# Patient Record
Sex: Male | Born: 1960 | Race: White | Hispanic: No | Marital: Married | Smoking: Former smoker
Health system: Southern US, Community
[De-identification: ages and names within clinical notes are randomized; demographics above are authoritative.]

## PROBLEM LIST (undated history)

## (undated) ENCOUNTER — Emergency Department (HOSPITAL_COMMUNITY): Payer: BLUE CROSS/BLUE SHIELD | Source: Home / Self Care

## (undated) DIAGNOSIS — C609 Malignant neoplasm of penis, unspecified: Secondary | ICD-10-CM

## (undated) DIAGNOSIS — I714 Abdominal aortic aneurysm, without rupture, unspecified: Secondary | ICD-10-CM

## (undated) DIAGNOSIS — I723 Aneurysm of iliac artery: Secondary | ICD-10-CM

## (undated) DIAGNOSIS — S0990XA Unspecified injury of head, initial encounter: Secondary | ICD-10-CM

## (undated) DIAGNOSIS — S82891A Other fracture of right lower leg, initial encounter for closed fracture: Secondary | ICD-10-CM

## (undated) DIAGNOSIS — I5022 Chronic systolic (congestive) heart failure: Secondary | ICD-10-CM

## (undated) DIAGNOSIS — S82892A Other fracture of left lower leg, initial encounter for closed fracture: Secondary | ICD-10-CM

## (undated) DIAGNOSIS — I2119 ST elevation (STEMI) myocardial infarction involving other coronary artery of inferior wall: Secondary | ICD-10-CM

## (undated) DIAGNOSIS — I10 Essential (primary) hypertension: Secondary | ICD-10-CM

## (undated) DIAGNOSIS — I480 Paroxysmal atrial fibrillation: Secondary | ICD-10-CM

## (undated) DIAGNOSIS — I2510 Atherosclerotic heart disease of native coronary artery without angina pectoris: Secondary | ICD-10-CM

## (undated) HISTORY — DX: Abdominal aortic aneurysm, without rupture, unspecified: I71.40

## (undated) HISTORY — DX: Aneurysm of iliac artery: I72.3

## (undated) HISTORY — DX: Atherosclerotic heart disease of native coronary artery without angina pectoris: I25.10

## (undated) HISTORY — DX: Malignant neoplasm of penis, unspecified: C60.9

## (undated) HISTORY — PX: HERNIA REPAIR: SHX51

## (undated) HISTORY — DX: Abdominal aortic aneurysm, without rupture: I71.4

## (undated) HISTORY — DX: ST elevation (STEMI) myocardial infarction involving other coronary artery of inferior wall: I21.19

---
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
## 2012-10-29 ENCOUNTER — Encounter (HOSPITAL_COMMUNITY): Payer: Self-pay | Admitting: Emergency Medicine

## 2012-10-29 ENCOUNTER — Emergency Department (HOSPITAL_COMMUNITY)
Admission: EM | Admit: 2012-10-29 | Discharge: 2012-10-29 | Disposition: A | Discharge status: Home / Self Care | Payer: 59 | Attending: Emergency Medicine | Admitting: Emergency Medicine

## 2012-10-29 DIAGNOSIS — T7840XA Allergy, unspecified, initial encounter: Secondary | ICD-10-CM

## 2012-10-29 DIAGNOSIS — F17200 Nicotine dependence, unspecified, uncomplicated: Secondary | ICD-10-CM | POA: Insufficient documentation

## 2012-10-29 DIAGNOSIS — I10 Essential (primary) hypertension: Secondary | ICD-10-CM | POA: Insufficient documentation

## 2012-10-29 DIAGNOSIS — R059 Cough, unspecified: Secondary | ICD-10-CM | POA: Insufficient documentation

## 2012-10-29 DIAGNOSIS — L299 Pruritus, unspecified: Secondary | ICD-10-CM | POA: Insufficient documentation

## 2012-10-29 DIAGNOSIS — R05 Cough: Secondary | ICD-10-CM | POA: Insufficient documentation

## 2012-10-29 MED ORDER — PREDNISONE 10 MG PO TABS: 20.0000 mg | ORAL_TABLET | Freq: Every day | ORAL | Status: DC

## 2012-10-29 MED ORDER — METHYLPREDNISOLONE SODIUM SUCC 125 MG IJ SOLR
125.0000 mg | Freq: Once | INTRAMUSCULAR | Status: AC
  Administered 2012-10-29: 125 mg via INTRAVENOUS
  Filled 2012-10-29: qty 2

## 2012-10-29 MED ORDER — SODIUM CHLORIDE 0.9 % IN NEBU
INHALATION_SOLUTION | RESPIRATORY_TRACT | Status: AC
  Administered 2012-10-29: 02:00:00
  Filled 2012-10-29: qty 3

## 2012-10-29 MED ORDER — ALBUTEROL SULFATE (5 MG/ML) 0.5% IN NEBU
2.5000 mg | INHALATION_SOLUTION | Freq: Once | RESPIRATORY_TRACT | Status: AC
  Administered 2012-10-29: 2.5 mg via RESPIRATORY_TRACT
  Filled 2012-10-29: qty 0.5

## 2012-10-29 MED ORDER — ONDANSETRON HCL 4 MG/2ML IJ SOLN
INTRAMUSCULAR | Status: AC
  Filled 2012-10-29: qty 2

## 2012-10-29 MED ORDER — DIPHENHYDRAMINE HCL 50 MG/ML IJ SOLN
25.0000 mg | Freq: Once | INTRAMUSCULAR | Status: AC
  Administered 2012-10-29: 25 mg via INTRAVENOUS
  Filled 2012-10-29: qty 1

## 2012-10-29 MED ORDER — FAMOTIDINE 20 MG PO TABS
20.0000 mg | ORAL_TABLET | Freq: Once | ORAL | Status: AC
  Administered 2012-10-29: 20 mg via ORAL
  Filled 2012-10-29: qty 1

## 2012-10-29 MED ORDER — ONDANSETRON HCL 4 MG/2ML IJ SOLN
4.0000 mg | Freq: Once | INTRAMUSCULAR | Status: AC
  Administered 2012-10-29: 4 mg via INTRAVENOUS

## 2012-10-29 NOTE — ED Provider Notes (Signed)
History     CSN: [id number]  Arrival date & time 10/29/12  0101   First MD Initiated Contact with Patient 10/29/12 0133      Chief Complaint  Patient presents with  . Allergic Reaction  . Shortness of Breath    (Consider location/radiation/quality/duration/timing/severity/associated sxs/prior treatment) HPI  Jeremy Raymond is a 51 y.o. male who presents to the Emergency Department complaining of allergic reaction that woke him from sleep with itching, hives, feeling hot, coughing. He is not aware of any new exposures, foods, clothing.Denies fever, chills, shortness of breath. He states he has had a cough for several days and with the onset of hives and itching his throat felt scratchy.  History reviewed. No pertinent past medical history.  History reviewed. No pertinent past surgical history.  No family history on file.  History  Substance Use Topics  . Smoking status: Current Every Day Smoker  . Smokeless tobacco: Not on file  . Alcohol Use: Yes      Review of Systems  Constitutional: Negative for fever.       10 Systems reviewed and are negative for acute change except as noted in the HPI.  HENT: Negative for congestion.   Eyes: Negative for discharge and redness.  Respiratory: Negative for cough and shortness of breath.   Cardiovascular: Negative for chest pain.  Gastrointestinal: Negative for vomiting and abdominal pain.  Musculoskeletal: Negative for back pain.  Skin: Negative for rash.       Hives, rash, itching  Neurological: Negative for syncope, numbness and headaches.  Psychiatric/Behavioral:       No behavior change.    Allergies  Review of patient's allergies indicates no known allergies.  Home Medications   Current Outpatient Rx  Name Route Sig Dispense Refill  . PREDNISONE 10 MG PO TABS Oral Take 2 tablets (20 mg total) by mouth daily. 10 tablet 0    BP 146/98  Pulse 93  Temp 97.8 F (36.6 C) (Oral)  Resp 18  Ht 5\' 9"  (1.753 m)  Wt  255 lb (115.667 kg)  BMI 37.66 kg/m2  SpO2 96%  Physical Exam  Nursing note and vitals reviewed. Constitutional: He is oriented to person, place, and time. He appears well-developed and well-nourished.       Awake, alert, nontoxic appearance.  HENT:  Head: Atraumatic.  Right Ear: External ear normal.  Left Ear: External ear normal.  Nose: Nose normal.  Mouth/Throat: Oropharynx is clear and moist.  Eyes: Right eye exhibits no discharge. Left eye exhibits no discharge.  Neck: Neck supple.  Cardiovascular: Normal heart sounds.   Pulmonary/Chest: Effort normal. He exhibits no tenderness.       Coughing, rales at both bases that clear partially with cough  Abdominal: Soft. Bowel sounds are normal. There is no tenderness. There is no rebound.  Musculoskeletal: Normal range of motion. He exhibits no tenderness.       Baseline ROM, no obvious new focal weakness.  Neurological: He is alert and oriented to person, place, and time.       Mental status and motor strength appears baseline for patient and situation.  Skin: Rash noted.       Hives to groin, buttock, axilla, face  Psychiatric: He has a normal mood and affect.    ED Course  Procedures (including critical care time)   Date: 10/29/2012    0131  Rate: 109  Rhythm: sinus tachycardia  QRS Axis: right  Intervals: normal  ST/T Wave abnormalities: normal  Conduction Disutrbances:none  Narrative Interpretation:   Old EKG Reviewed: none available   1. Allergic reaction       MDM  Patient with non specific allergic reaction characterized by hives, itching, erythematous rash. Given solumedrol, pepcid, benadryl with resolution of rash, itching, and hives. Received albuterol/atrovent for rales with improvement.  Observed in the ER with no recurrence.  Pt feels improved after observation and/or treatment in ED.Pt stable in ED with no significant deterioration in condition.The patient appears reasonably screened and/or stabilized for  discharge and I doubt any other medical condition or other [facility] requiring further screening, evaluation, or treatment in the ED at this time prior to discharge.  MDM Reviewed: nursing note and vitals           Nicoletta Dress. Colon Branch, MD 10/29/12 [id number]

## 2012-10-29 NOTE — ED Notes (Signed)
Patient states he ate jalapeno peppers, grilled onions and ranch tonight.  States he began having shortness of breath around midnight; states ate at 8pm.  Patient states he feels like his throat is swelling; patient handling secretions well.  Patient able to speak in complete sentences without difficulty.

## 2017-04-10 ENCOUNTER — Other Ambulatory Visit: Payer: Self-pay | Admitting: Urology

## 2017-05-16 ENCOUNTER — Encounter (HOSPITAL_BASED_OUTPATIENT_CLINIC_OR_DEPARTMENT_OTHER): Payer: Self-pay | Admitting: *Deleted

## 2017-05-16 NOTE — Progress Notes (Signed)
To [facility] at 0600-Istat,Ekg on arrival-Npo after Mn-no smoking also.

## 2017-05-22 NOTE — H&P (Signed)
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  He has had difficulties with rolling his foreskin back for 2 years. His symptoms have gotten worse over the last year. He does not have diabetes.   He does not have dysuria. He has had inflammation of the head of his penis. He has not had to use creams on the lesion.   He was evaluated in 3/18 for penile pain. He indicated he works 7 days a week and developed abrasions on the glans and distal shaft of his penis because of his clothing and sweating in the summer of 2017. Since then he indicated he had difficulty with recurrent abrasions/irritation to the penis. It occurred about every other week. He also indicated he was concerned that his penis was shrinking as his shaft skin had begun to develop the glans (weight 270 pounds). A clean catch urine appeared to be infected and he was placed on empiric antibiotics but the urine culture proved negative and was likely a contaminated specimen.    He said that over 2 years ago after eating some hot sauce he developed a rash and difficulty breathing and into the having to go to the hospital. He said since that time he has not been able to pull the skin back over the head of his penis although he has been circumcised as an infant. This condition would be considered a moderate severity with no modifying factors or other associated signs impotence.      ALLERGIES: No Known Drug Allergies Pepper - Hives    MEDICATIONS: Lisinopril 10 mg tablet  Aspirin Ec 81 mg tablet, delayed release     GU PSH: None   NON-GU PSH: Hernia Repair    GU PMH: None   NON-GU PMH: Hypertension    FAMILY HISTORY: cardiac disorder - Father Death of family member - Father   SOCIAL HISTORY: Marital Status: Married Current Smoking Status: Patient smokes. Has smoked since 03/30/1977. Smokes 1 pack per day.   Tobacco Use Assessment Completed: Used Tobacco in  last 30 days? Does not use smokeless tobacco. Drinks 3 drinks per month.  Drinks 1 caffeinated drink per day. Patient's occupation Geneticist, molecular.    REVIEW OF SYSTEMS:    GU Review Male:   Patient reports frequent urination, burning/ pain with urination, get up at night to urinate, leakage of urine, stream starts and stops, trouble starting your stream, and have to strain to urinate . Patient denies hard to postpone urination, erection problems, and penile pain.  Gastrointestinal (Upper):   Patient denies nausea, vomiting, and indigestion/ heartburn.  Gastrointestinal (Lower):   Patient denies diarrhea and constipation.  Constitutional:   Patient denies fever, night sweats, weight loss, and fatigue.  Skin:   Patient denies skin rash/ lesion and itching.  Eyes:   Patient denies blurred vision and double vision.  Ears/ Nose/ Throat:   Patient reports sinus problems. Patient denies sore throat.  Hematologic/Lymphatic:   Patient denies swollen glands and easy bruising.  Cardiovascular:   Patient denies leg swelling and chest pains.  Respiratory:   Patient reports cough. Patient denies shortness of breath.  Endocrine:   Patient denies excessive thirst.  Musculoskeletal:   Patient denies back pain and joint pain.  Neurological:   Patient denies dizziness and headaches.  Psychologic:   Patient denies depression and anxiety.   VITAL SIGNS:   Weight 262 lb / 118.84 kg  Height 69  in / 175.26 cm  BP 128/88 mmHg  Pulse 76 /min  Temperature 97.6 F / 36 C  BMI 38.7 kg/m   GU PHYSICAL EXAMINATION:    Scrotum: No lesions. No edema. No cysts. No warts.  Epididymides: Right: no spermatocele, no masses, no cysts, no tenderness, no induration, no enlargement. Left: no spermatocele, no masses, no cysts, no tenderness, no induration, no enlargement.  Testes: No tenderness, no swelling, no enlargement left testes. No tenderness, no swelling, no enlargement right testes. Normal location left  testes. Normal location right testes. No mass, no cyst, no varicocele, no hydrocele left testes. No mass, no cyst, no varicocele, no hydrocele right testes.  Urethral Meatus: Normal size. No lesion, no wart, no discharge, no polyp. Normal location.  Penis: Circumcised but the glans is enveloped with the shaft skin which has been pushed out over the end of the penis and this skin has circumferential depigmentation and narrowing to the point where it cannot be pulled back over the head of the penis. Palpating the glans I note some nodularity distally that seemed to be tender to palpation as well. Due to his significant phimosis I was unable to visualize this area.   MULTI-SYSTEM PHYSICAL EXAMINATION:    Constitutional: Well-nourished. No physical deformities. Normally developed. Good grooming.  Neck: Neck symmetrical, not swollen. Normal tracheal position.  Respiratory: No labored breathing, no use of accessory muscles.   Cardiovascular: Normal temperature, normal extremity pulses, no swelling, no varicosities.  Lymphatic: No enlargement of neck, axillae, groin.  Skin: No paleness, no jaundice, no cyanosis. No lesion, no ulcer, no rash.  Neurologic / Psychiatric: Oriented to time, oriented to place, oriented to person. No depression, no anxiety, no agitation.  Gastrointestinal: Obese abdomen. No mass, no tenderness, no rigidity.   Eyes: Normal conjunctivae. Normal eyelids.  Ears, Nose, Mouth, and Throat: Left ear no scars, no lesions, no masses. Right ear no scars, no lesions, no masses. Nose no scars, no lesions, no masses. Normal hearing. Normal lips.  Musculoskeletal: Normal gait and station of head and neck.     PAST DATA REVIEWED:  Source Of History:  Patient  Lab Test Review:   PSA, BUN/Creatinine  Records Review:   Previous Doctor Records, POC Tool  Urine Test Review:   Urine Culture   04/01/17  PSA  Total PSA 0.60 ng/dl   Notes:                     His creatinine was noted to be  normal in 4/18 at 0.99.   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 I have reassured him the penis does not shrink.  2   Phimosis - N47.1 His obesity has caused the skin from the shaft of his penis to envelop the glans and it is then become scarred down with possible BXO. This may just be scarring from previous irritation but either way the skin cannot be retracted over the head of the penis. The problem is he has been circumcised already and therefore excising more penile shaft skin will result in very little shaft skin being left and I told him that if he would lose weight he would bend and up having too little skin on the penis which would result in the scrotum riding up onto the shaft. He told me that he did not think he would be losing weight any time soon. He understands this risk. It is unfortunately necessary in order to allow his relatively buried penis to be visualized and evaluated further.   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 I have discussed with the patient the fact that once visualized biopsy of this area may be necessary.     We discussed the procedure planned including the incision used, the risks and complications, the probability of success, the outpatient nature of the procedure as well as the anticipated postoperative course. He understands and has elected to proceed.

## 2017-05-23 ENCOUNTER — Encounter (HOSPITAL_BASED_OUTPATIENT_CLINIC_OR_DEPARTMENT_OTHER): Payer: Self-pay | Admitting: *Deleted

## 2017-05-23 ENCOUNTER — Encounter (HOSPITAL_BASED_OUTPATIENT_CLINIC_OR_DEPARTMENT_OTHER)
Admission: RE | Disposition: A | Discharge status: Home / Self Care | Payer: Self-pay | Source: Ambulatory Visit | Attending: Urology

## 2017-05-23 ENCOUNTER — Ambulatory Visit (HOSPITAL_BASED_OUTPATIENT_CLINIC_OR_DEPARTMENT_OTHER)
Admission: RE | Admit: 2017-05-23 | Discharge: 2017-05-23 | Disposition: A | Discharge status: Home / Self Care | Payer: BLUE CROSS/BLUE SHIELD | Source: Ambulatory Visit | Attending: Urology | Admitting: Urology

## 2017-05-23 ENCOUNTER — Other Ambulatory Visit: Payer: Self-pay

## 2017-05-23 ENCOUNTER — Ambulatory Visit (HOSPITAL_BASED_OUTPATIENT_CLINIC_OR_DEPARTMENT_OTHER): Payer: BLUE CROSS/BLUE SHIELD | Admitting: Anesthesiology

## 2017-05-23 DIAGNOSIS — N4889 Other specified disorders of penis: Secondary | ICD-10-CM | POA: Insufficient documentation

## 2017-05-23 DIAGNOSIS — Z6837 Body mass index (BMI) 37.0-37.9, adult: Secondary | ICD-10-CM | POA: Diagnosis not present

## 2017-05-23 DIAGNOSIS — N480 Leukoplakia of penis: Secondary | ICD-10-CM | POA: Diagnosis not present

## 2017-05-23 DIAGNOSIS — I10 Essential (primary) hypertension: Secondary | ICD-10-CM | POA: Insufficient documentation

## 2017-05-23 DIAGNOSIS — N476 Balanoposthitis: Secondary | ICD-10-CM | POA: Diagnosis not present

## 2017-05-23 DIAGNOSIS — F17210 Nicotine dependence, cigarettes, uncomplicated: Secondary | ICD-10-CM | POA: Diagnosis not present

## 2017-05-23 DIAGNOSIS — E669 Obesity, unspecified: Secondary | ICD-10-CM | POA: Insufficient documentation

## 2017-05-23 DIAGNOSIS — N359 Urethral stricture, unspecified: Secondary | ICD-10-CM | POA: Insufficient documentation

## 2017-05-23 DIAGNOSIS — N4883 Acquired buried penis: Secondary | ICD-10-CM | POA: Diagnosis not present

## 2017-05-23 DIAGNOSIS — Z7982 Long term (current) use of aspirin: Secondary | ICD-10-CM | POA: Insufficient documentation

## 2017-05-23 DIAGNOSIS — C601 Malignant neoplasm of glans penis: Secondary | ICD-10-CM | POA: Insufficient documentation

## 2017-05-23 DIAGNOSIS — N471 Phimosis: Secondary | ICD-10-CM

## 2017-05-23 HISTORY — DX: Other fracture of right lower leg, initial encounter for closed fracture: S82.891A

## 2017-05-23 HISTORY — DX: Other fracture of left lower leg, initial encounter for closed fracture: S82.892A

## 2017-05-23 HISTORY — DX: Unspecified injury of head, initial encounter: S09.90XA

## 2017-05-23 HISTORY — PX: PENILE BIOPSY: SHX6013

## 2017-05-23 HISTORY — PX: CIRCUMCISION: SHX1350

## 2017-05-23 HISTORY — DX: Essential (primary) hypertension: I10

## 2017-05-23 LAB — POCT I-STAT, CHEM 8
BUN: 17 mg/dL (ref 6–20)
Calcium, Ion: 1.27 mmol/L (ref 1.15–1.40)
Chloride: 100 mmol/L — ABNORMAL LOW (ref 101–111)
Creatinine, Ser: 1 mg/dL (ref 0.61–1.24)
Glucose, Bld: 94 mg/dL (ref 65–99)
HCT: 48 % (ref 39.0–52.0)
Hemoglobin: 16.3 g/dL (ref 13.0–17.0)
Potassium: 4.5 mmol/L (ref 3.5–5.1)
Sodium: 138 mmol/L (ref 135–145)
TCO2: 27 mmol/L (ref 0–100)

## 2017-05-23 SURGERY — CIRCUMCISION, ADULT
Anesthesia: General | Site: Penis

## 2017-05-23 MED ORDER — FENTANYL CITRATE (PF) 100 MCG/2ML IJ SOLN
INTRAMUSCULAR | Status: DC | PRN
  Administered 2017-05-23 (×2): 50 ug via INTRAVENOUS

## 2017-05-23 MED ORDER — OXYCODONE HCL 5 MG/5ML PO SOLN
5.0000 mg | Freq: Once | ORAL | Status: DC | PRN
  Filled 2017-05-23: qty 5

## 2017-05-23 MED ORDER — CEFAZOLIN SODIUM-DEXTROSE 2-4 GM/100ML-% IV SOLN
2.0000 g | INTRAVENOUS | Status: AC
  Administered 2017-05-23: 2 g via INTRAVENOUS
  Filled 2017-05-23: qty 100

## 2017-05-23 MED ORDER — BACITRACIN-NEOMYCIN-POLYMYXIN 400-5-5000 EX OINT
TOPICAL_OINTMENT | CUTANEOUS | Status: DC | PRN
Start: 2017-05-23 — End: 2017-05-23
  Administered 2017-05-23: 1 via TOPICAL

## 2017-05-23 MED ORDER — HYDROCODONE-ACETAMINOPHEN 10-325 MG PO TABS: 1.0000 | ORAL_TABLET | ORAL | 0 refills | Status: DC | PRN

## 2017-05-23 MED ORDER — LIDOCAINE 2% (20 MG/ML) 5 ML SYRINGE
INTRAMUSCULAR | Status: DC | PRN
  Administered 2017-05-23: 100 mg via INTRAVENOUS

## 2017-05-23 MED ORDER — PROPOFOL 10 MG/ML IV BOLUS
INTRAVENOUS | Status: AC
  Filled 2017-05-23: qty 40

## 2017-05-23 MED ORDER — DEXAMETHASONE SODIUM PHOSPHATE 10 MG/ML IJ SOLN
INTRAMUSCULAR | Status: AC
  Filled 2017-05-23: qty 1

## 2017-05-23 MED ORDER — CEFAZOLIN SODIUM-DEXTROSE 2-4 GM/100ML-% IV SOLN
INTRAVENOUS | Status: AC
  Filled 2017-05-23: qty 100

## 2017-05-23 MED ORDER — MIDAZOLAM HCL 5 MG/5ML IJ SOLN
INTRAMUSCULAR | Status: DC | PRN
  Administered 2017-05-23: 2 mg via INTRAVENOUS

## 2017-05-23 MED ORDER — DEXAMETHASONE SODIUM PHOSPHATE 10 MG/ML IJ SOLN
INTRAMUSCULAR | Status: DC | PRN
  Administered 2017-05-23: 10 mg via INTRAVENOUS

## 2017-05-23 MED ORDER — ONDANSETRON HCL 4 MG/2ML IJ SOLN
4.0000 mg | Freq: Four times a day (QID) | INTRAMUSCULAR | Status: DC | PRN
  Filled 2017-05-23: qty 2

## 2017-05-23 MED ORDER — BUPIVACAINE HCL (PF) 0.25 % IJ SOLN
INTRAMUSCULAR | Status: DC | PRN
  Administered 2017-05-23: 10 mL

## 2017-05-23 MED ORDER — MIDAZOLAM HCL 2 MG/2ML IJ SOLN
INTRAMUSCULAR | Status: AC
  Filled 2017-05-23: qty 2

## 2017-05-23 MED ORDER — FENTANYL CITRATE (PF) 100 MCG/2ML IJ SOLN
25.0000 ug | INTRAMUSCULAR | Status: DC | PRN
  Administered 2017-05-23 (×2): 50 ug via INTRAVENOUS
  Filled 2017-05-23: qty 1

## 2017-05-23 MED ORDER — PROPOFOL 10 MG/ML IV BOLUS
INTRAVENOUS | Status: DC | PRN
  Administered 2017-05-23: 200 mg via INTRAVENOUS

## 2017-05-23 MED ORDER — ONDANSETRON HCL 4 MG/2ML IJ SOLN
INTRAMUSCULAR | Status: DC | PRN
  Administered 2017-05-23: 4 mg via INTRAVENOUS

## 2017-05-23 MED ORDER — FENTANYL CITRATE (PF) 100 MCG/2ML IJ SOLN
INTRAMUSCULAR | Status: AC
  Filled 2017-05-23: qty 2

## 2017-05-23 MED ORDER — LABETALOL HCL 5 MG/ML IV SOLN
INTRAVENOUS | Status: DC | PRN
  Administered 2017-05-23 (×2): 5 mg via INTRAVENOUS

## 2017-05-23 MED ORDER — LACTATED RINGERS IV SOLN
INTRAVENOUS | Status: DC
  Administered 2017-05-23 (×2): via INTRAVENOUS
  Filled 2017-05-23: qty 1000

## 2017-05-23 MED ORDER — LABETALOL HCL 5 MG/ML IV SOLN
INTRAVENOUS | Status: AC
  Filled 2017-05-23: qty 4

## 2017-05-23 MED ORDER — KETOROLAC TROMETHAMINE 30 MG/ML IJ SOLN
INTRAMUSCULAR | Status: DC | PRN
  Administered 2017-05-23: 30 mg via INTRAVENOUS

## 2017-05-23 MED ORDER — ONDANSETRON HCL 4 MG/2ML IJ SOLN
INTRAMUSCULAR | Status: AC
  Filled 2017-05-23: qty 2

## 2017-05-23 MED ORDER — LIDOCAINE 2% (20 MG/ML) 5 ML SYRINGE
INTRAMUSCULAR | Status: AC
  Filled 2017-05-23: qty 5

## 2017-05-23 MED ORDER — SULFAMETHOXAZOLE-TRIMETHOPRIM 400-80 MG PO TABS: 1.0000 | ORAL_TABLET | Freq: Two times a day (BID) | ORAL | 0 refills | Status: DC

## 2017-05-23 MED ORDER — OXYCODONE HCL 5 MG PO TABS
5.0000 mg | ORAL_TABLET | Freq: Once | ORAL | Status: DC | PRN
  Filled 2017-05-23: qty 1

## 2017-05-23 MED ORDER — KETOROLAC TROMETHAMINE 30 MG/ML IJ SOLN
INTRAMUSCULAR | Status: AC
  Filled 2017-05-23: qty 1

## 2017-05-23 SURGICAL SUPPLY — 35 items
BANDAGE CO FLEX L/F 1IN X 5YD (GAUZE/BANDAGES/DRESSINGS) IMPLANT
BANDAGE CO FLEX L/F 2IN X 5YD (GAUZE/BANDAGES/DRESSINGS) ×2 IMPLANT
BLADE CLIPPER SURG (BLADE) ×2 IMPLANT
BLADE SURG 15 STRL LF DISP TIS (BLADE) ×1 IMPLANT
BLADE SURG 15 STRL SS (BLADE) ×1
BNDG COHESIVE 1X5 TAN STRL LF (GAUZE/BANDAGES/DRESSINGS) ×2 IMPLANT
CLOTH BEACON ORANGE TIMEOUT ST (SAFETY) ×2 IMPLANT
COVER BACK TABLE 60X90IN (DRAPES) ×2 IMPLANT
COVER MAYO STAND STRL (DRAPES) ×2 IMPLANT
DRAIN PENROSE 18X1/4 LTX STRL (WOUND CARE) ×2 IMPLANT
DRAPE LAPAROTOMY 100X72 PEDS (DRAPES) ×2 IMPLANT
ELECT REM PT RETURN 9FT ADLT (ELECTROSURGICAL) ×2
ELECTRODE REM PT RTRN 9FT ADLT (ELECTROSURGICAL) ×1 IMPLANT
GLOVE BIO SURGEON STRL SZ8 (GLOVE) ×2 IMPLANT
GOWN STRL REUS W/ TWL LRG LVL3 (GOWN DISPOSABLE) ×1 IMPLANT
GOWN STRL REUS W/ TWL XL LVL3 (GOWN DISPOSABLE) ×1 IMPLANT
GOWN STRL REUS W/TWL LRG LVL3 (GOWN DISPOSABLE) ×1
GOWN STRL REUS W/TWL XL LVL3 (GOWN DISPOSABLE) ×1
IV NS IRRIG 3000ML ARTHROMATIC (IV SOLUTION) IMPLANT
KIT RM TURNOVER CYSTO AR (KITS) ×2 IMPLANT
MANIFOLD NEPTUNE II (INSTRUMENTS) IMPLANT
NEEDLE HYPO 22GX1.5 SAFETY (NEEDLE) ×2 IMPLANT
NS IRRIG 500ML POUR BTL (IV SOLUTION) IMPLANT
PACK BASIN DAY SURGERY FS (CUSTOM PROCEDURE TRAY) ×2 IMPLANT
PENCIL BUTTON HOLSTER BLD 10FT (ELECTRODE) ×2 IMPLANT
SUPPORT SCROTAL XLRG NO STRP (MISCELLANEOUS) ×2 IMPLANT
SUT CHROMIC 3 0 SH 27 (SUTURE) ×4 IMPLANT
SUT CHROMIC 4 0 RB 1X27 (SUTURE) IMPLANT
SUT CHROMIC 5 0 RB 1 27 (SUTURE) ×2 IMPLANT
SYR CONTROL 10ML LL (SYRINGE) ×2 IMPLANT
TOWEL OR 17X24 6PK STRL BLUE (TOWEL DISPOSABLE) ×4 IMPLANT
TRAY DSU PREP LF (CUSTOM PROCEDURE TRAY) ×2 IMPLANT
TUBE CONNECTING 12X1/4 (SUCTIONS) ×2 IMPLANT
WATER STERILE IRR 3000ML UROMA (IV SOLUTION) ×2 IMPLANT
WATER STERILE IRR 500ML POUR (IV SOLUTION) ×2 IMPLANT

## 2017-05-23 NOTE — Transfer of Care (Signed)
Immediate Anesthesia Transfer of Care Note  Patient: Jeremy Raymond  Procedure(s) Performed: Procedure(s): CIRCUMCISION ADULT (N/A) PENILE BIOPSY (N/A)  Patient Location: PACU  Anesthesia Type:General  Level of Consciousness: sedated and responds to stimulation  Airway & Oxygen Therapy: Patient Spontanous Breathing and Patient connected to nasal cannula oxygen  Post-op Assessment: Report given to RN  Post vital signs: Reviewed and stable  Last Vitals:133/84, 72, 16, 93% 98.4 Vitals:   05/23/17 0615  BP: 118/75  Pulse: 70  Resp: 20  Temp: 37.2 C    Last Pain:  Vitals:   05/23/17 0621  TempSrc:   PainSc: 5       Patients Stated Pain Goal: 8 (16/10/96 0454)  Complications: No apparent anesthesia complications

## 2017-05-23 NOTE — Anesthesia Procedure Notes (Signed)
Procedure Name: LMA Insertion Date/Time: 05/23/2017 7:30 AM Performed by: Bethena Roys T Pre-anesthesia Checklist: Patient identified, Emergency Drugs available, Suction available and Patient being monitored Patient Re-evaluated:Patient Re-evaluated prior to inductionOxygen Delivery Method: Circle system utilized Preoxygenation: Pre-oxygenation with 100% oxygen Intubation Type: IV induction Ventilation: Mask ventilation without difficulty LMA: LMA inserted LMA Size: 5.0 Number of attempts: 1 Airway Equipment and Method: Bite block Placement Confirmation: positive ETCO2 Tube secured with: Tape Dental Injury: Teeth and Oropharynx as per pre-operative assessment

## 2017-05-23 NOTE — Anesthesia Preprocedure Evaluation (Signed)
Anesthesia Evaluation  Patient identified by MRN, date of birth, ID band Patient awake    Reviewed: Allergy & Precautions, H&P , NPO status , Patient's Chart, lab work & pertinent test results  Airway Mallampati: II   Neck ROM: full    Dental   Pulmonary Current Smoker,    breath sounds clear to auscultation       Cardiovascular hypertension,  Rhythm:regular Rate:Normal     Neuro/Psych    GI/Hepatic   Endo/Other  obese  Renal/GU      Musculoskeletal   Abdominal   Peds  Hematology   Anesthesia Other Findings   Reproductive/Obstetrics                             Anesthesia Physical Anesthesia Plan  ASA: II  Anesthesia Plan: General   Post-op Pain Management:    Induction: Intravenous  Airway Management Planned: LMA  Additional Equipment:   Intra-op Plan:   Post-operative Plan:   Informed Consent: I have reviewed the patients History and Physical, chart, labs and discussed the procedure including the risks, benefits and alternatives for the proposed anesthesia with the patient or authorized representative who has indicated his/her understanding and acceptance.     Plan Discussed with: CRNA, Anesthesiologist and Surgeon  Anesthesia Plan Comments:         Anesthesia Quick Evaluation

## 2017-05-23 NOTE — Anesthesia Postprocedure Evaluation (Signed)
Anesthesia Post Note  Patient: Jeremy Raymond  Procedure(s) Performed: Procedure(s) (LRB): CIRCUMCISION ADULT (N/A) PENILE BIOPSY (N/A)  Patient location during evaluation: PACU Anesthesia Type: General Level of consciousness: awake and alert and patient cooperative Pain management: pain level controlled Vital Signs Assessment: post-procedure vital signs reviewed and stable Respiratory status: spontaneous breathing and respiratory function stable Cardiovascular status: stable Anesthetic complications: no       Last Vitals:  Vitals:   05/23/17 1020 05/23/17 1022  BP: (!) 178/89 (!) 155/96  Pulse: 70   Resp: 18   Temp: 36.6 C     Last Pain:  Vitals:   05/23/17 1020  TempSrc: Oral  PainSc:                  Elsinore S

## 2017-05-23 NOTE — Discharge Instructions (Signed)
Post Anesthesia Home Care Instructions  Activity: Get plenty of rest for the remainder of the day. A responsible individual must stay with you for 24 hours following the procedure.  For the next 24 hours, DO NOT: -Drive a car -Paediatric nurse -Drink alcoholic beverages -Take any medication unless instructed by your physician -Make any legal decisions or sign important papers.  Meals: Start with liquid foods such as gelatin or soup. Progress to regular foods as tolerated. Avoid greasy, spicy, heavy foods. If nausea and/or vomiting occur, drink only clear liquids until the nausea and/or vomiting subsides. Call your physician if vomiting continues.  Special Instructions/Symptoms: Your throat may feel dry or sore from the anesthesia or the breathing tube placed in your throat during surgery. If this causes discomfort, gargle with warm salt water. The discomfort should disappear within 24 hours.  If you had a scopolamine patch placed behind your ear for the management of post- operative nausea and/or vomiting:  1. The medication in the patch is effective for 72 hours, after which it should be removed.  Wrap patch in a tissue and discard in the trash. Wash hands thoroughly with soap and water. 2. You may remove the patch earlier than 72 hours if you experience unpleasant side effects which may include dry mouth, dizziness or visual disturbances. 3. Avoid touching the patch. Wash your hands with soap and water after contact with the patch.   Postoperative instructions for circumcision  Wound:  In most cases your incision will have absorbable sutures that run along the course of your incision and will dissolve within the first 10-20 days. Some will fall out even earlier. Expect some redness as the sutures dissolved but this should occur only around the sutures. If there is generalized redness, especially with increasing pain or swelling, let us know. The penis will very likely get "black and  blue" as the blood in the tissues spread. Sometimes the whole penis will turn colors. The black and blue is followed by a yellow and brown color. In time, all the discoloration will go away.  Diet:  You may return to your normal diet within 24 hours following your surgery. You may note some mild nausea and possibly vomiting the first 6-8 hours following surgery. This is usually due to the side effects of anesthesia, and will disappear quite soon. I would suggest clear liquids and a very light meal the first evening following your surgery.  Activity:  Your physical activity should be restricted the first 48 hours. During that time you should remain relatively inactive, moving about only when necessary. During the first 7-10 days following surgery he should avoid lifting any heavy objects (anything greater than 15 pounds), and avoid strenuous exercise. If you work, ask [place] specifically about your restrictions, both for work and home. We will write a note to your employer if needed.  Ice packs can be placed on and off over the penis for the first 48 hours to help relieve the pain and keep the swelling down. Frozen peas or corn in a ZipLock bag can be frozen, used and re-frozen. Fifteen minutes on and 15 minutes off is a reasonable schedule.   Hygiene:  You may shower 48 hours after your surgery. Tub bathing should be restricted until the seventh day.  Medication:  You will be sent home with some type of pain medication. In many cases you will be sent home with a narcotic pain pill (Vicodin or Tylox). If the pain is not too bad,  you may take either Tylenol (acetaminophen) or Advil (ibuprofen) which contain no narcotic agents, and might be tolerated a little better, with fewer side effects. If the pain medication you are sent home with does not control the pain, you will have to let us know. Some narcotic pain medications cannot be given or refilled by a phone call to a pharmacy.  Problems you should  report to [place]:   Fever of 101.0 degrees Fahrenheit or greater.  Moderate or severe swelling under the skin incision or involving the scrotum.  Drug reaction such as hives, a rash, nausea or vomiting.

## 2017-05-23 NOTE — Op Note (Signed)
PATIENT:  Jeremy Raymond  PRE-OPERATIVE DIAGNOSIS: 1. Penile pain 2. Acquired buried penis. 3. Phimosis 4. Glans mass.  POST-OPERATIVE DIAGNOSIS: 1. Penile pain 2. Acquired buried penis. 3. Phimosis 4. Glans mass. 5. Balanoposthitis  6. BXO 7. Meatal stenosis  PROCEDURE: 1. Circumcision 2. Biopsy of glans penis  3. Meatal dilation.  SURGEON:  Claybon Jabs  INDICATION: Jeremy Raymond is a 56 year old male who has been having penile pain for 2 years. He also has developed the inability to retract his foreskin. He was noted on examination to have depigmentation of the distal shaft skin which had been developed the penis and resulted in an inability to retract the skin over the head of the penis. In addition he had a firm area felt in the area of the glans on examination but it could not be visualized.  ANESTHESIA:  General  EBL: 5 mL   DRAINS: None  LOCAL MEDICATIONS USED: 10 mL of 1/2% plain Marcaine   SPECIMEN:  1. Aerobic culture  2. Biopsy of glans 3. Distal shaft skin  Description of procedure: After informed consent the patient was taken to the operating room and placed on the table in a supine position. General anesthesia was then administered. Once fully anesthetized the genitalia were sterilely prepped and draped in standard fashion. An official timeout was then performed.   I initially noted that when I tried to retract the shaft skin over the head of the penis there was tan purulent material consistent with infection beneath the enveloping shaft skin so I obtained a swab from this location for culture and sensitivity. I then placed a straight hemostat across the skin at the 12:00 position and clamped this. After releasing this I cut and was able to then retract the foreskin after developing this small dorsal slit. This allowed evaluation of the glans.  I first noted that there was a circumferential firm, slightly rough-appearing area involving the surface around the  urethral meatus which had resulted in significant meatal stenosis. I therefore dilated the urethral meatus using a hemostat. I then proceeded to obtain a wedge biopsy of the glans from the area of abnormality. This was then closed with interrupted 5-0 chromic.  I then made a circumcising incision about 6 mm proximal to the corona circumferentially. I made a second circumcising incision taking care not to excise too much shaft skin but remove enough of the clearly abnormal appearing distal shaft skin. Bleeding points were cauterized and I then reapproximated the skin edges with running, 3-0 chromic suture. Neosporin was applied to the incision, I performed a dorsal penile block with the Marcaine and fluffed Curlex and a scrotal support were applied and the patient was awakened and taken to the recovery room in stable and satisfactory condition. He tolerated the procedure well with no intraoperative complications.   PLAN OF CARE: Discharge to home after PACU  PATIENT DISPOSITION:  PACU - hemodynamically stable.

## 2017-05-25 LAB — AEROBIC CULTURE W GRAM STAIN (SUPERFICIAL SPECIMEN): Culture: NORMAL

## 2017-05-27 ENCOUNTER — Encounter (HOSPITAL_BASED_OUTPATIENT_CLINIC_OR_DEPARTMENT_OTHER): Payer: Self-pay | Admitting: Urology

## 2018-04-15 ENCOUNTER — Ambulatory Visit: Payer: BLUE CROSS/BLUE SHIELD | Admitting: Vascular Surgery

## 2018-04-15 ENCOUNTER — Encounter: Payer: Self-pay | Admitting: Vascular Surgery

## 2018-04-15 VITALS — BP 130/80 | HR 80 | Temp 98.3°F | Resp 12

## 2018-04-15 DIAGNOSIS — I723 Aneurysm of iliac artery: Secondary | ICD-10-CM | POA: Diagnosis not present

## 2018-04-15 DIAGNOSIS — I714 Abdominal aortic aneurysm, without rupture, unspecified: Secondary | ICD-10-CM
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
## 2018-04-16 ENCOUNTER — Other Ambulatory Visit: Payer: Self-pay

## 2018-04-16 DIAGNOSIS — I713 Abdominal aortic aneurysm, ruptured, unspecified: Secondary | ICD-10-CM

## 2018-04-28 ENCOUNTER — Other Ambulatory Visit: Payer: Self-pay

## 2018-04-28 DIAGNOSIS — I713 Abdominal aortic aneurysm, ruptured, unspecified: Secondary | ICD-10-CM

## 2018-05-04 ENCOUNTER — Other Ambulatory Visit: Payer: Self-pay

## 2018-05-04 ENCOUNTER — Emergency Department (HOSPITAL_COMMUNITY): Payer: BLUE CROSS/BLUE SHIELD

## 2018-05-04 ENCOUNTER — Emergency Department (HOSPITAL_COMMUNITY)
Admission: EM | Admit: 2018-05-04 | Discharge: 2018-05-04 | Disposition: A | Discharge status: Home / Self Care | Payer: BLUE CROSS/BLUE SHIELD | Attending: Emergency Medicine | Admitting: Emergency Medicine

## 2018-05-04 ENCOUNTER — Encounter (HOSPITAL_COMMUNITY): Payer: Self-pay

## 2018-05-04 DIAGNOSIS — F17200 Nicotine dependence, unspecified, uncomplicated: Secondary | ICD-10-CM | POA: Diagnosis not present

## 2018-05-04 DIAGNOSIS — Z859 Personal history of malignant neoplasm, unspecified: Secondary | ICD-10-CM | POA: Diagnosis not present

## 2018-05-04 DIAGNOSIS — R339 Retention of urine, unspecified: Secondary | ICD-10-CM | POA: Diagnosis present

## 2018-05-04 DIAGNOSIS — N5089 Other specified disorders of the male genital organs: Secondary | ICD-10-CM | POA: Insufficient documentation

## 2018-05-04 DIAGNOSIS — I10 Essential (primary) hypertension: Secondary | ICD-10-CM | POA: Diagnosis not present

## 2018-05-04 DIAGNOSIS — Z79899 Other long term (current) drug therapy: Secondary | ICD-10-CM | POA: Diagnosis not present

## 2018-05-04 LAB — URINALYSIS, ROUTINE W REFLEX MICROSCOPIC
BILIRUBIN URINE: NEGATIVE
Glucose, UA: NEGATIVE mg/dL
Ketones, ur: NEGATIVE mg/dL
NITRITE: NEGATIVE
Protein, ur: NEGATIVE mg/dL
SPECIFIC GRAVITY, URINE: 1.02 (ref 1.005–1.030)
pH: 5 (ref 5.0–8.0)

## 2018-05-04 LAB — CBC WITH DIFFERENTIAL/PLATELET
BASOS PCT: 0 %
Basophils Absolute: 0 10*3/uL (ref 0.0–0.1)
EOS ABS: 0.4 10*3/uL (ref 0.0–0.7)
Eosinophils Relative: 5 %
HCT: 43.1 % (ref 39.0–52.0)
Hemoglobin: 14.5 g/dL (ref 13.0–17.0)
Lymphocytes Relative: 8 %
Lymphs Abs: 0.6 10*3/uL — ABNORMAL LOW (ref 0.7–4.0)
MCH: 32.3 pg (ref 26.0–34.0)
MCHC: 33.6 g/dL (ref 30.0–36.0)
MCV: 96 fL (ref 78.0–100.0)
Monocytes Absolute: 0.5 10*3/uL (ref 0.1–1.0)
Monocytes Relative: 6 %
NEUTROS PCT: 81 %
Neutro Abs: 6 10*3/uL (ref 1.7–7.7)
PLATELETS: 297 10*3/uL (ref 150–400)
RBC: 4.49 MIL/uL (ref 4.22–5.81)
RDW: 13.1 % (ref 11.5–15.5)
WBC: 7.5 10*3/uL (ref 4.0–10.5)

## 2018-05-04 LAB — COMPREHENSIVE METABOLIC PANEL
ALT: 14 U/L — ABNORMAL LOW (ref 17–63)
ANION GAP: 11 (ref 5–15)
AST: 14 U/L — ABNORMAL LOW (ref 15–41)
Albumin: 3.5 g/dL (ref 3.5–5.0)
Alkaline Phosphatase: 125 U/L (ref 38–126)
BUN: 11 mg/dL (ref 6–20)
CALCIUM: 8.9 mg/dL (ref 8.9–10.3)
CHLORIDE: 103 mmol/L (ref 101–111)
CO2: 25 mmol/L (ref 22–32)
Creatinine, Ser: 0.94 mg/dL (ref 0.61–1.24)
GFR calc non Af Amer: >60 mL/min (ref >60)
Glucose, Bld: 101 mg/dL — ABNORMAL HIGH (ref 65–99)
Potassium: 4 mmol/L (ref 3.5–5.1)
SODIUM: 139 mmol/L (ref 135–145)
Total Bilirubin: 0.3 mg/dL (ref 0.3–1.2)
Total Protein: 7.4 g/dL (ref 6.5–8.1)

## 2018-05-04 MED ORDER — SODIUM CHLORIDE 0.9 % IV BOLUS
1000.0000 mL | Freq: Once | INTRAVENOUS | Status: AC
  Administered 2018-05-04: 1000 mL via INTRAVENOUS

## 2018-05-04 MED ORDER — ONDANSETRON HCL 4 MG/2ML IJ SOLN
4.0000 mg | Freq: Once | INTRAMUSCULAR | Status: AC
  Administered 2018-05-04: 4 mg via INTRAVENOUS
  Filled 2018-05-04: qty 2

## 2018-05-04 MED ORDER — HYDROMORPHONE HCL 1 MG/ML IJ SOLN
1.0000 mg | Freq: Once | INTRAMUSCULAR | Status: AC
  Administered 2018-05-04: 1 mg via INTRAVENOUS
  Filled 2018-05-04: qty 1

## 2018-05-04 MED ORDER — CEPHALEXIN 500 MG PO CAPS: 500.0000 mg | ORAL_CAPSULE | Freq: Four times a day (QID) | ORAL | 0 refills | Status: AC

## 2018-05-04 MED ORDER — MORPHINE SULFATE (PF) 4 MG/ML IV SOLN
4.0000 mg | Freq: Once | INTRAVENOUS | Status: AC
  Administered 2018-05-04: 4 mg via INTRAVENOUS
  Filled 2018-05-04: qty 1
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
## 2018-05-04 NOTE — ED Triage Notes (Addendum)
Patient states he has bilateral scrotal swelling. Patient has a history of penile cancer. patiaent also reports that he has had problems urinating x 1 week and states he has not voided since 0900.

## 2018-05-04 NOTE — Discharge Instructions (Addendum)
Please take antibiotic for the next week.   Follow up with your Urologist Dr. Edd Arbour regarding your ER visit.   Please return to the Emergency Department for any new or concerning symptoms like fever >100.74F, vomiting, if you are unable to urinate or have any new or worsening pain.

## 2018-05-04 NOTE — ED Notes (Signed)
Have explained to pt that we are waiting on Urology. RN has called urology to see about what time pt could expect them. Pt is being informed but is still getting impatient and walking out in hall.

## 2018-05-04 NOTE — ED Notes (Signed)
Reassured patient and family that a provider will see him shortly-apologized for the delay

## 2018-05-04 NOTE — ED Notes (Signed)
Ultrasound at bedside

## 2018-05-20 ENCOUNTER — Ambulatory Visit (HOSPITAL_COMMUNITY): Admission: RE | Admit: 2018-05-20 | Payer: BLUE CROSS/BLUE SHIELD | Source: Ambulatory Visit

## 2018-05-20 ENCOUNTER — Ambulatory Visit: Payer: BLUE CROSS/BLUE SHIELD | Admitting: Vascular Surgery

## 2018-05-21 NOTE — Progress Notes (Deleted)
Established Abdominal Aortic Aneurysm   History of Present Illness   Jeremy Raymond is a 57 y.o. (01/03/1961) male who presents with chief complaint: follow up on CTA abd/pelvis.  Pt has undergone partial penectomy and XRT.  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  The patient returns today with CTA abd/pelvis.  The patient's also recently was seen in the ED with difficulty urinating related to possible urethral stricture.  The patient's PMH, PSH, SH, and FamHx were reviewed on 05/27/18 are unchanged from 04/15/18.  Current Outpatient Medications  Medication Sig Dispense Refill  . HYDROcodone-acetaminophen (NORCO) 10-325 MG tablet Take 1-2 tablets by mouth every 4 (four) hours as needed for moderate pain. Maximum dose per 24 hours - 8 pills (Patient not taking: Reported on 05/04/2018) 30 tablet 0  . lisinopril (PRINIVIL,ZESTRIL) 10 MG tablet Take 10 mg by mouth daily.    . predniSONE (DELTASONE) 10 MG tablet Take 2 tablets (20 mg total) by mouth daily. (Patient not taking: Reported on 05/04/2018) 10 tablet 0  . sulfamethoxazole-trimethoprim (BACTRIM) 400-80 MG tablet Take 1 tablet by mouth 2 (two) times daily. (Patient not taking: Reported on 05/04/2018) 10 tablet 0  . traMADol (ULTRAM) 50 MG tablet Take 50 mg by mouth every 6 (six) hours as needed for moderate pain.     No current facility-administered medications for this visit.     On ROS today: ***, ***   Physical Examination  ***There were no vitals filed for this visit. ***There is no height or weight on file to calculate BMI.  General {LOC:19197::"Somulent","Alert"}, {Orientation:19197::"Confused","O x 3"}, {Weight:19197::"Obese","Cachectic","WD"}, {General state of health:19197::"Ill appearing","Elderly","NAD"}  Pulmonary {Chest wall:19197::"Asx chest movement","Sym exp"}, {Air movt:19197::"Decreased *** air movt","good B air movt"}, {BS:19197::"rales on ***","rhonchi on ***","wheezing on  ***","CTA B"}  Cardiac {Rhythm:19197::"Irregularly, irregular rate and rhythm","RRR, Nl S1, S2"}, {Murmur:19197::"Murmur present: ***","no Murmurs"}, {Rubs:19197::"Rub present: ***","No rubs"}, {Gallop:19197::"Gallop present: ***","No S3,S4"}  Vascular Vessel Right Left  Radial {Palpable:19197::"Not palpable","Faintly palpable","Palpable"} {Palpable:19197::"Not palpable","Faintly palpable","Palpable"}  Brachial {Palpable:19197::"Not palpable","Faintly palpable","Palpable"} {Palpable:19197::"Not palpable","Faintly palpable","Palpable"}  Carotid Palpable, {Bruit:19197::"Bruit present","No Bruit"} Palpable, {Bruit:19197::"Bruit present","No Bruit"}  Aorta Not palpable N/A  Femoral {Palpable:19197::"Not palpable","Faintly palpable","Palpable"} {Palpable:19197::"Not palpable","Faintly palpable","Palpable"}  Popliteal {Palpable:19197::"Prominently palpable","Not palpable"} {Palpable:19197::"Prominently palpable","Not palpable"}  PT {Palpable:19197::"Not palpable","Faintly palpable","Palpable"} {Palpable:19197::"Not palpable","Faintly palpable","Palpable"}  DP {Palpable:19197::"Not palpable","Faintly palpable","Palpable"} {Palpable:19197::"Not palpable","Faintly palpable","Palpable"}    Gastro- intestinal soft, {Distension:19197::"distended","non-distended"}, {TTP:19197::"TTP in *** quadrant","appropriate tenderness to palpation","non-tender to palpation"}, {Guarding:19197::"Guarding and rebound in *** quadrant","No guarding or rebound"}, {HSM:19197::"HSM present","no HSM"}, {Masses:19197::"Mass present: ***","no masses"}, {Flank:19197::"CVAT on ***","Flank bruit present on ***","no CVAT B"}, {AAA:19197::"Palpable prominent aortic pulse","No palpable prominent aortic pulse"}, {Surgical incision:19197::"Surg incisions: ***","Surgical incisions well healed"," "}  Musculo- skeletal M/S 5/5 throughout {MS:19197::"except ***"," "}, Extremities without ischemic changes {MS:19197::"except ***"," "},  {Edema:19197::"Pitting edema present: ***","Non-pitting edema present: ***","No edema present"}, {Varicosities:19197::"Varicosities present: ***","No visible varicosities "}, {LDS:19197::"Lipodermatosclerosis present: ***","No Lipodermatosclerosis present"}  Neurologic Pain and light touch intact in extremities{CN:19197::" except for decreased sensation in ***"," "}, Motor exam as listed above    Radiology     CTA Abd/pelvis (***) ***   Medical Decision Making   Jeremy Raymond is a 57 y.o. (03/04/1961) male who presents with: asx small AAA, large R CIA aneurysm   Based on this patient's exam and diagnostic studies, he needs ***.  The threshold for repair is AAA size > 5.5 cm, growth > 1 cm/yr, and symptomatic status.  I emphasized the importance of maximal medical management including strict control of blood pressure, blood glucose,  and lipid levels, antiplatelet agents, obtaining regular exercise, and cessation of smoking.    Thank you for allowing [place] to participate in this patient's care.   Adele Barthel, MD, FACS [facility] Office: [phone] Pager: [phone]

## 2018-05-27 ENCOUNTER — Ambulatory Visit: Payer: BLUE CROSS/BLUE SHIELD | Admitting: Vascular Surgery

## 2018-06-18 ENCOUNTER — Emergency Department (HOSPITAL_COMMUNITY): Payer: BLUE CROSS/BLUE SHIELD

## 2018-06-18 ENCOUNTER — Encounter (HOSPITAL_COMMUNITY): Payer: Self-pay | Admitting: Emergency Medicine

## 2018-06-18 ENCOUNTER — Other Ambulatory Visit: Payer: Self-pay

## 2018-06-18 ENCOUNTER — Inpatient Hospital Stay (HOSPITAL_COMMUNITY)
Admission: EM | Admit: 2018-06-18 | Discharge: 2018-06-23 | DRG: 247 | Disposition: A | Discharge status: Home / Self Care | Payer: BLUE CROSS/BLUE SHIELD | Source: Other Acute Inpatient Hospital | Attending: Cardiology | Admitting: Cardiology

## 2018-06-18 ENCOUNTER — Encounter (HOSPITAL_COMMUNITY)
Admission: EM | Disposition: A | Discharge status: Home / Self Care | Payer: Self-pay | Source: Other Acute Inpatient Hospital | Attending: Cardiovascular Disease

## 2018-06-18 DIAGNOSIS — I472 Ventricular tachycardia: Secondary | ICD-10-CM | POA: Diagnosis not present

## 2018-06-18 DIAGNOSIS — I10 Essential (primary) hypertension: Secondary | ICD-10-CM | POA: Diagnosis present

## 2018-06-18 DIAGNOSIS — Z6835 Body mass index (BMI) 35.0-35.9, adult: Secondary | ICD-10-CM

## 2018-06-18 DIAGNOSIS — E785 Hyperlipidemia, unspecified: Secondary | ICD-10-CM | POA: Diagnosis present

## 2018-06-18 DIAGNOSIS — I714 Abdominal aortic aneurysm, without rupture: Secondary | ICD-10-CM | POA: Diagnosis present

## 2018-06-18 DIAGNOSIS — I2111 ST elevation (STEMI) myocardial infarction involving right coronary artery: Secondary | ICD-10-CM | POA: Diagnosis present

## 2018-06-18 DIAGNOSIS — I213 ST elevation (STEMI) myocardial infarction of unspecified site: Secondary | ICD-10-CM

## 2018-06-18 DIAGNOSIS — C609 Malignant neoplasm of penis, unspecified: Secondary | ICD-10-CM | POA: Diagnosis present

## 2018-06-18 DIAGNOSIS — I2119 ST elevation (STEMI) myocardial infarction involving other coronary artery of inferior wall: Secondary | ICD-10-CM | POA: Diagnosis present

## 2018-06-18 DIAGNOSIS — I2510 Atherosclerotic heart disease of native coronary artery without angina pectoris: Secondary | ICD-10-CM

## 2018-06-18 DIAGNOSIS — Z79891 Long term (current) use of opiate analgesic: Secondary | ICD-10-CM | POA: Diagnosis not present

## 2018-06-18 DIAGNOSIS — I959 Hypotension, unspecified: Secondary | ICD-10-CM | POA: Diagnosis not present

## 2018-06-18 DIAGNOSIS — I471 Supraventricular tachycardia: Secondary | ICD-10-CM | POA: Diagnosis not present

## 2018-06-18 DIAGNOSIS — I4510 Unspecified right bundle-branch block: Secondary | ICD-10-CM | POA: Diagnosis present

## 2018-06-18 DIAGNOSIS — E669 Obesity, unspecified: Secondary | ICD-10-CM | POA: Diagnosis present

## 2018-06-18 DIAGNOSIS — Z8249 Family history of ischemic heart disease and other diseases of the circulatory system: Secondary | ICD-10-CM | POA: Diagnosis not present

## 2018-06-18 DIAGNOSIS — F17210 Nicotine dependence, cigarettes, uncomplicated: Secondary | ICD-10-CM | POA: Diagnosis present

## 2018-06-18 DIAGNOSIS — Z91018 Allergy to other foods: Secondary | ICD-10-CM

## 2018-06-18 DIAGNOSIS — Z833 Family history of diabetes mellitus: Secondary | ICD-10-CM | POA: Diagnosis not present

## 2018-06-18 DIAGNOSIS — Z79899 Other long term (current) drug therapy: Secondary | ICD-10-CM

## 2018-06-18 DIAGNOSIS — Z955 Presence of coronary angioplasty implant and graft: Secondary | ICD-10-CM

## 2018-06-18 DIAGNOSIS — I723 Aneurysm of iliac artery: Secondary | ICD-10-CM | POA: Diagnosis present

## 2018-06-18 HISTORY — PX: LEFT HEART CATH AND CORONARY ANGIOGRAPHY: CATH118249

## 2018-06-18 HISTORY — PX: CORONARY/GRAFT ACUTE MI REVASCULARIZATION: CATH118305

## 2018-06-18 LAB — LIPID PANEL
CHOL/HDL RATIO: 3.3 ratio
Cholesterol: 129 mg/dL (ref 0–200)
HDL: 39 mg/dL — ABNORMAL LOW (ref >40)
LDL Cholesterol: 76 mg/dL (ref 0–99)
Triglycerides: 71 mg/dL (ref <150)
VLDL: 14 mg/dL (ref 0–40)

## 2018-06-18 LAB — CBC WITH DIFFERENTIAL/PLATELET
Abs Immature Granulocytes: 0 10*3/uL (ref 0.0–0.1)
BASOS PCT: 1 %
Basophils Absolute: 0 10*3/uL (ref 0.0–0.1)
EOS ABS: 0.1 10*3/uL (ref 0.0–0.7)
EOS PCT: 2 %
HCT: 40.4 % (ref 39.0–52.0)
Hemoglobin: 12.9 g/dL — ABNORMAL LOW (ref 13.0–17.0)
Immature Granulocytes: 1 %
Lymphocytes Relative: 7 %
Lymphs Abs: 0.6 10*3/uL — ABNORMAL LOW (ref 0.7–4.0)
MCH: 30.2 pg (ref 26.0–34.0)
MCHC: 31.9 g/dL (ref 30.0–36.0)
MCV: 94.6 fL (ref 78.0–100.0)
MONO ABS: 0.4 10*3/uL (ref 0.1–1.0)
MONOS PCT: 5 %
NEUTROS PCT: 84 %
Neutro Abs: 6.6 10*3/uL (ref 1.7–7.7)
Platelets: 294 10*3/uL (ref 150–400)
RBC: 4.27 MIL/uL (ref 4.22–5.81)
RDW: 13.1 % (ref 11.5–15.5)
WBC: 7.8 10*3/uL (ref 4.0–10.5)

## 2018-06-18 LAB — PROTIME-INR
INR: 1.1
Prothrombin Time: 14.1 seconds (ref 11.4–15.2)

## 2018-06-18 LAB — BRAIN NATRIURETIC PEPTIDE: B Natriuretic Peptide: 74.9 pg/mL (ref 0.0–100.0)

## 2018-06-18 LAB — HEMOGLOBIN A1C
Hgb A1c MFr Bld: 5.5 % (ref 4.8–5.6)
MEAN PLASMA GLUCOSE: 111.15 mg/dL

## 2018-06-18 LAB — APTT: APTT: 134 s — AB (ref 24–36)

## 2018-06-18 SURGERY — CORONARY/GRAFT ACUTE MI REVASCULARIZATION
Anesthesia: LOCAL

## 2018-06-18 MED ORDER — SODIUM CHLORIDE 0.9 % WEIGHT BASED INFUSION
1.0000 mL/kg/h | INTRAVENOUS | Status: AC
  Administered 2018-06-18: 1.001 mL/kg/h via INTRAVENOUS
  Administered 2018-06-19: 1 mL/kg/h via INTRAVENOUS

## 2018-06-18 MED ORDER — SODIUM CHLORIDE 0.9 % IV SOLN: 250.0000 mL | INTRAVENOUS | Status: DC | PRN

## 2018-06-18 MED ORDER — LIDOCAINE HCL (PF) 1 % IJ SOLN
INTRAMUSCULAR | Status: AC
  Filled 2018-06-18: qty 30

## 2018-06-18 MED ORDER — SODIUM CHLORIDE 0.9% FLUSH: 3.0000 mL | INTRAVENOUS | Status: DC | PRN

## 2018-06-18 MED ORDER — HEPARIN (PORCINE) IN NACL 2-0.9 UNITS/ML
INTRAMUSCULAR | Status: AC | PRN
  Administered 2018-06-18: 1000 mL

## 2018-06-18 MED ORDER — TRAMADOL HCL 50 MG PO TABS
50.0000 mg | ORAL_TABLET | Freq: Four times a day (QID) | ORAL | Status: DC | PRN
  Administered 2018-06-20 – 2018-06-23 (×7): 50 mg via ORAL
  Filled 2018-06-18 (×7): qty 1

## 2018-06-18 MED ORDER — CARVEDILOL 3.125 MG PO TABS
6.2500 mg | ORAL_TABLET | Freq: Two times a day (BID) | ORAL | Status: DC
  Administered 2018-06-19 – 2018-06-23 (×8): 6.25 mg via ORAL
  Filled 2018-06-18 (×3): qty 2
  Filled 2018-06-18 (×6): qty 1

## 2018-06-18 MED ORDER — ASPIRIN 81 MG PO CHEW: 324.0000 mg | CHEWABLE_TABLET | Freq: Once | ORAL | Status: DC

## 2018-06-18 MED ORDER — TICAGRELOR 90 MG PO TABS
90.0000 mg | ORAL_TABLET | Freq: Two times a day (BID) | ORAL | Status: DC
  Administered 2018-06-19 – 2018-06-23 (×9): 90 mg via ORAL
  Filled 2018-06-18 (×10): qty 1

## 2018-06-18 MED ORDER — LIDOCAINE HCL (PF) 1 % IJ SOLN
INTRAMUSCULAR | Status: DC | PRN
  Administered 2018-06-18: 2 mL via SUBCUTANEOUS

## 2018-06-18 MED ORDER — TICAGRELOR 90 MG PO TABS
ORAL_TABLET | ORAL | Status: DC | PRN
  Administered 2018-06-18: 180 mg via ORAL

## 2018-06-18 MED ORDER — NITROGLYCERIN 1 MG/10 ML FOR IR/CATH LAB
INTRA_ARTERIAL | Status: AC
  Filled 2018-06-18: qty 10

## 2018-06-18 MED ORDER — MORPHINE SULFATE (PF) 2 MG/ML IV SOLN
1.0000 mg | INTRAVENOUS | Status: DC | PRN
  Administered 2018-06-18 – 2018-06-22 (×2): 1 mg via INTRAVENOUS
  Filled 2018-06-18 (×2): qty 1

## 2018-06-18 MED ORDER — NITROGLYCERIN 1 MG/10 ML FOR IR/CATH LAB
INTRA_ARTERIAL | Status: DC | PRN
  Administered 2018-06-18: 200 ug via INTRACORONARY

## 2018-06-18 MED ORDER — ASPIRIN EC 81 MG PO TBEC
81.0000 mg | DELAYED_RELEASE_TABLET | Freq: Every day | ORAL | Status: DC
  Administered 2018-06-19 – 2018-06-23 (×4): 81 mg via ORAL
  Filled 2018-06-18 (×4): qty 1

## 2018-06-18 MED ORDER — NITROGLYCERIN 0.4 MG SL SUBL
SUBLINGUAL_TABLET | SUBLINGUAL | Status: AC
Start: 2018-06-18 — End: 2018-06-19
  Filled 2018-06-18: qty 1

## 2018-06-18 MED ORDER — FENTANYL CITRATE (PF) 100 MCG/2ML IJ SOLN
INTRAMUSCULAR | Status: AC
  Filled 2018-06-18: qty 2

## 2018-06-18 MED ORDER — HYDRALAZINE HCL 20 MG/ML IJ SOLN: 5.0000 mg | INTRAMUSCULAR | Status: AC | PRN

## 2018-06-18 MED ORDER — LABETALOL HCL 5 MG/ML IV SOLN: 10.0000 mg | INTRAVENOUS | Status: AC | PRN

## 2018-06-18 MED ORDER — NITROGLYCERIN 0.4 MG SL SUBL
0.4000 mg | SUBLINGUAL_TABLET | SUBLINGUAL | Status: DC | PRN
  Administered 2018-06-18: 0.4 mg via SUBLINGUAL
  Filled 2018-06-18: qty 1

## 2018-06-18 MED ORDER — ONDANSETRON HCL 4 MG/2ML IJ SOLN
4.0000 mg | Freq: Four times a day (QID) | INTRAMUSCULAR | Status: DC | PRN
  Filled 2018-06-18: qty 2

## 2018-06-18 MED ORDER — ACETAMINOPHEN 325 MG PO TABS
650.0000 mg | ORAL_TABLET | ORAL | Status: DC | PRN
  Administered 2018-06-19: 650 mg via ORAL
  Filled 2018-06-18: qty 2

## 2018-06-18 MED ORDER — SODIUM CHLORIDE 0.9 % IV SOLN
INTRAVENOUS | Status: DC
  Administered 2018-06-18: 19:00:00 via INTRAVENOUS

## 2018-06-18 MED ORDER — HEPARIN SODIUM (PORCINE) 5000 UNIT/ML IJ SOLN
5000.0000 [IU] | Freq: Three times a day (TID) | INTRAMUSCULAR | Status: DC
  Administered 2018-06-19 – 2018-06-22 (×10): 5000 [IU] via SUBCUTANEOUS
  Filled 2018-06-18 (×10): qty 1

## 2018-06-18 MED ORDER — VERAPAMIL HCL 2.5 MG/ML IV SOLN
INTRAVENOUS | Status: AC
  Filled 2018-06-18: qty 2

## 2018-06-18 MED ORDER — MIDAZOLAM HCL 2 MG/2ML IJ SOLN
INTRAMUSCULAR | Status: AC
  Filled 2018-06-18: qty 2

## 2018-06-18 MED ORDER — HEPARIN SODIUM (PORCINE) 1000 UNIT/ML IJ SOLN
INTRAMUSCULAR | Status: AC
  Filled 2018-06-18: qty 1

## 2018-06-18 MED ORDER — HEPARIN SODIUM (PORCINE) 1000 UNIT/ML IJ SOLN
INTRAMUSCULAR | Status: DC | PRN
  Administered 2018-06-18: 3000 [IU] via INTRAVENOUS
  Administered 2018-06-18: 8000 [IU] via INTRAVENOUS

## 2018-06-18 MED ORDER — VERAPAMIL HCL 2.5 MG/ML IV SOLN
INTRAVENOUS | Status: DC | PRN
  Administered 2018-06-18: 10 mL via INTRA_ARTERIAL

## 2018-06-18 MED ORDER — LABETALOL HCL 5 MG/ML IV SOLN
INTRAVENOUS | Status: DC | PRN
  Administered 2018-06-18: 10 mg via INTRAVENOUS

## 2018-06-18 MED ORDER — SODIUM CHLORIDE 0.9% FLUSH
3.0000 mL | Freq: Two times a day (BID) | INTRAVENOUS | Status: DC
  Administered 2018-06-19 – 2018-06-22 (×5): 3 mL via INTRAVENOUS

## 2018-06-18 MED ORDER — FENTANYL CITRATE (PF) 100 MCG/2ML IJ SOLN
INTRAMUSCULAR | Status: DC | PRN
  Administered 2018-06-18: 25 ug via INTRAVENOUS

## 2018-06-18 MED ORDER — NITROGLYCERIN 0.4 MG SL SUBL: 0.4000 mg | SUBLINGUAL_TABLET | SUBLINGUAL | Status: DC | PRN

## 2018-06-18 MED ORDER — TICAGRELOR 90 MG PO TABS
ORAL_TABLET | ORAL | Status: AC
  Filled 2018-06-18: qty 2

## 2018-06-18 MED ORDER — ATORVASTATIN CALCIUM 80 MG PO TABS
80.0000 mg | ORAL_TABLET | Freq: Every day | ORAL | Status: DC
  Administered 2018-06-19 – 2018-06-22 (×4): 80 mg via ORAL
  Filled 2018-06-18 (×5): qty 1

## 2018-06-18 MED ORDER — GI COCKTAIL ~~LOC~~
30.0000 mL | Freq: Two times a day (BID) | ORAL | Status: DC | PRN
  Administered 2018-06-18: 30 mL via ORAL
  Filled 2018-06-18: qty 30

## 2018-06-18 MED ORDER — MIDAZOLAM HCL 2 MG/2ML IJ SOLN
INTRAMUSCULAR | Status: DC | PRN
  Administered 2018-06-18: 2 mg via INTRAVENOUS

## 2018-06-18 MED ORDER — SODIUM CHLORIDE 0.9 % IV SOLN
INTRAVENOUS | Status: AC | PRN
  Administered 2018-06-18: 100 mL/h via INTRAVENOUS

## 2018-06-18 MED ORDER — HEPARIN (PORCINE) IN NACL 1000-0.9 UT/500ML-% IV SOLN
INTRAVENOUS | Status: AC
  Filled 2018-06-18: qty 1000

## 2018-06-18 MED ORDER — IOHEXOL 350 MG/ML SOLN
INTRAVENOUS | Status: DC | PRN
  Administered 2018-06-18: 120 mL via INTRA_ARTERIAL

## 2018-06-18 MED ORDER — LABETALOL HCL 5 MG/ML IV SOLN
INTRAVENOUS | Status: AC
  Filled 2018-06-18: qty 4

## 2018-06-18 SURGICAL SUPPLY — 17 items
BALLN ~~LOC~~ EMERGE MR 4.0X8 (BALLOONS) ×2
BALLOON ~~LOC~~ EMERGE MR 4.0X8 (BALLOONS) ×1 IMPLANT
CATH IMPULSE 5F ANG/FL3.5 (CATHETERS) ×2 IMPLANT
CATH LAUNCHER 6FR AL1 (CATHETERS) ×1 IMPLANT
CATHETER LAUNCHER 6FR AL1 (CATHETERS) ×2
DEVICE RAD COMP TR BAND LRG (VASCULAR PRODUCTS) ×2 IMPLANT
GLIDESHEATH SLEND SS 6F .021 (SHEATH) ×2 IMPLANT
GUIDEWIRE INQWIRE 1.5J.035X260 (WIRE) ×1 IMPLANT
INQWIRE 1.5J .035X260CM (WIRE) ×2
KIT ENCORE 26 ADVANTAGE (KITS) ×2 IMPLANT
KIT HEART LEFT (KITS) ×2 IMPLANT
PACK CARDIAC CATHETERIZATION (CUSTOM PROCEDURE TRAY) ×2 IMPLANT
STENT SIERRA 3.50 X 18 MM (Permanent Stent) ×2 IMPLANT
SYR MEDRAD MARK V 150ML (SYRINGE) ×2 IMPLANT
TRANSDUCER W/STOPCOCK (MISCELLANEOUS) ×2 IMPLANT
TUBING CIL FLEX 10 FLL-RA (TUBING) ×2 IMPLANT
WIRE COUGAR XT STRL 190CM (WIRE) ×2 IMPLANT

## 2018-06-18 NOTE — ED Triage Notes (Signed)
Pt presents from [facility] as a Code STEMI, chest pain started 4-5 hours ago. Given 324 mg aspirin and 4000units of heparin PTA.
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
## 2018-06-18 NOTE — ED Notes (Signed)
Belongings given to wife. Waiting in [place] waiting area. Report given to cath lab at bedside and all transfer paperwork given to cath lab as well. 324 ASA geld due to given PTA to [facility].

## 2018-06-19 ENCOUNTER — Other Ambulatory Visit: Payer: Self-pay

## 2018-06-19 ENCOUNTER — Inpatient Hospital Stay (HOSPITAL_COMMUNITY): Payer: BLUE CROSS/BLUE SHIELD

## 2018-06-19 ENCOUNTER — Encounter (HOSPITAL_COMMUNITY): Payer: Self-pay | Admitting: Cardiovascular Disease

## 2018-06-19 DIAGNOSIS — I472 Ventricular tachycardia: Secondary | ICD-10-CM

## 2018-06-19 DIAGNOSIS — I2111 ST elevation (STEMI) myocardial infarction involving right coronary artery: Secondary | ICD-10-CM

## 2018-06-19 LAB — COMPREHENSIVE METABOLIC PANEL
ALK PHOS: 135 U/L — AB (ref 38–126)
ALT: 34 U/L (ref 17–63)
AST: 49 U/L — AB (ref 15–41)
Albumin: 3 g/dL — ABNORMAL LOW (ref 3.5–5.0)
Anion gap: 10 (ref 5–15)
BILIRUBIN TOTAL: 0.2 mg/dL — AB (ref 0.3–1.2)
BUN: 10 mg/dL (ref 6–20)
CALCIUM: 9.1 mg/dL (ref 8.9–10.3)
CHLORIDE: 99 mmol/L — AB (ref 101–111)
CO2: 25 mmol/L (ref 22–32)
CREATININE: 0.92 mg/dL (ref 0.61–1.24)
Glucose, Bld: 116 mg/dL — ABNORMAL HIGH (ref 65–99)
Potassium: 4.5 mmol/L (ref 3.5–5.1)
Sodium: 134 mmol/L — ABNORMAL LOW (ref 135–145)
Total Protein: 6.4 g/dL — ABNORMAL LOW (ref 6.5–8.1)

## 2018-06-19 LAB — TROPONIN I
Troponin I: 3.71 ng/mL (ref <0.03)
Troponin I: 11.84 ng/mL (ref <0.03)
Troponin I: 16.51 ng/mL (ref <0.03)

## 2018-06-19 LAB — CBC
HCT: 36.9 % — ABNORMAL LOW (ref 39.0–52.0)
HEMOGLOBIN: 11.9 g/dL — AB (ref 13.0–17.0)
MCH: 30.3 pg (ref 26.0–34.0)
MCHC: 32.2 g/dL (ref 30.0–36.0)
MCV: 93.9 fL (ref 78.0–100.0)
Platelets: 249 10*3/uL (ref 150–400)
RBC: 3.93 MIL/uL — ABNORMAL LOW (ref 4.22–5.81)
RDW: 13.1 % (ref 11.5–15.5)
WBC: 7.7 10*3/uL (ref 4.0–10.5)

## 2018-06-19 LAB — BASIC METABOLIC PANEL
ANION GAP: 9 (ref 5–15)
BUN: 8 mg/dL (ref 6–20)
CALCIUM: 8.9 mg/dL (ref 8.9–10.3)
CO2: 23 mmol/L (ref 22–32)
Chloride: 102 mmol/L (ref 101–111)
Creatinine, Ser: 0.78 mg/dL (ref 0.61–1.24)
GFR calc Af Amer: >60 mL/min (ref >60)
GFR calc non Af Amer: >60 mL/min (ref >60)
GLUCOSE: 116 mg/dL — AB (ref 65–99)
Potassium: 3.9 mmol/L (ref 3.5–5.1)
Sodium: 134 mmol/L — ABNORMAL LOW (ref 135–145)

## 2018-06-19 LAB — LIPID PANEL
CHOL/HDL RATIO: 3.5 ratio
Cholesterol: 117 mg/dL (ref 0–200)
HDL: 33 mg/dL — ABNORMAL LOW (ref >40)
LDL Cholesterol: 65 mg/dL (ref 0–99)
Triglycerides: 97 mg/dL (ref <150)
VLDL: 19 mg/dL (ref 0–40)

## 2018-06-19 LAB — GLUCOSE, CAPILLARY
GLUCOSE-CAPILLARY: 128 mg/dL — AB (ref 65–99)
Glucose-Capillary: 90 mg/dL (ref 65–99)

## 2018-06-19 LAB — ECHOCARDIOGRAM COMPLETE
Height: 69 in
Weight: 3840 oz

## 2018-06-19 LAB — POCT ACTIVATED CLOTTING TIME
Activated Clotting Time: 252 seconds
Activated Clotting Time: 263 seconds

## 2018-06-19 LAB — MRSA PCR SCREENING: MRSA BY PCR: NEGATIVE

## 2018-06-19 LAB — HIV ANTIBODY (ROUTINE TESTING W REFLEX): HIV SCREEN 4TH GENERATION: NONREACTIVE

## 2018-06-19 MED ORDER — NITROGLYCERIN IN D5W 200-5 MCG/ML-% IV SOLN
INTRAVENOUS | Status: AC
Start: 2018-06-19 — End: 2018-06-19
  Administered 2018-06-19: 5 ug/min via INTRAVENOUS
  Filled 2018-06-19: qty 250

## 2018-06-19 MED ORDER — PERFLUTREN LIPID MICROSPHERE
1.0000 mL | INTRAVENOUS | Status: AC | PRN
  Filled 2018-06-19: qty 10

## 2018-06-19 MED ORDER — PERFLUTREN LIPID MICROSPHERE
INTRAVENOUS | Status: AC
  Administered 2018-06-19: 2.2 mg
  Filled 2018-06-19: qty 10

## 2018-06-19 MED ORDER — LISINOPRIL 10 MG PO TABS
10.0000 mg | ORAL_TABLET | Freq: Every day | ORAL | Status: DC
  Administered 2018-06-19 – 2018-06-21 (×3): 10 mg via ORAL
  Filled 2018-06-19 (×3): qty 1

## 2018-06-19 MED ORDER — ISOSORBIDE MONONITRATE ER 30 MG PO TB24
30.0000 mg | ORAL_TABLET | Freq: Every day | ORAL | Status: DC
  Administered 2018-06-19 – 2018-06-23 (×5): 30 mg via ORAL
  Filled 2018-06-19 (×5): qty 1

## 2018-06-19 MED ORDER — NITROGLYCERIN IN D5W 200-5 MCG/ML-% IV SOLN
0.0000 ug/min | INTRAVENOUS | Status: DC
  Administered 2018-06-19: 5 ug/min via INTRAVENOUS

## 2018-06-19 NOTE — Progress Notes (Signed)
Progress Note  Patient Name: Jeremy Raymond Date of Encounter: 06/19/2018  Primary Cardiologist: Burt Knack  Subjective   Mild chest pain. No dyspnea.   Inpatient Medications    Scheduled Meds: . aspirin  324 mg Oral Once  . aspirin EC  81 mg Oral Daily  . atorvastatin  80 mg Oral q1800  . carvedilol  6.25 mg Oral BID WC  . heparin  5,000 Units Subcutaneous Q8H  . sodium chloride flush  3 mL Intravenous Q12H  . ticagrelor  90 mg Oral BID   Continuous Infusions: . sodium chloride Stopped (06/18/18 2215)  . sodium chloride    . nitroGLYCERIN 15 mcg/min (06/19/18 0600)   PRN Meds: sodium chloride, acetaminophen, gi cocktail, morphine injection, nitroGLYCERIN, nitroGLYCERIN, ondansetron (ZOFRAN) IV, sodium chloride flush, traMADol   Vital Signs    Vitals:   06/19/18 0515 06/19/18 0615 06/19/18 0630 06/19/18 0752  BP:  (!) 128/102 (!) 141/87   Pulse:      Resp:  (!) 8 11   Temp: 98.7 F (37.1 C)   98.2 F (36.8 C)  TempSrc: Oral   Oral  SpO2:      Weight:      Height:        Intake/Output Summary (Last 24 hours) at 06/19/2018 0803 Last data filed at 06/19/2018 0600 Gross per 24 hour  Intake 868.8 ml  Output 700 ml  Net 168.8 ml   Filed Weights   06/18/18 1912  Weight: 240 lb (108.9 kg)    Telemetry    Sinus, several 4-5 beat runs of NSVT over last 12 hours- Personally Reviewed  ECG    Sinus, subtle inferior ST elevation- Personally Reviewed  Physical Exam   GEN: No acute distress.   Neck: No JVD Cardiac: RRR, no murmurs, rubs, or gallops.  Respiratory: Clear to auscultation bilaterally. GI: Soft, nontender, non-distended  MS: No edema; No deformity. Neuro:  Nonfocal  Psych: Normal affect   Labs    Chemistry Recent Labs  Lab 06/18/18 2232 06/19/18 0326  NA 134* 134*  K 4.5 3.9  CL 99* 102  CO2 25 23  GLUCOSE 116* 116*  BUN 10 8  CREATININE 0.92 0.78  CALCIUM 9.1 8.9  PROT 6.4*  --   ALBUMIN 3.0*  --   AST 49*  --   ALT 34  --     ALKPHOS 135*  --   BILITOT 0.2*  --   GFRNONAA >60 >60  GFRAA >60 >60  ANIONGAP 10 9     Hematology Recent Labs  Lab 06/18/18 2232 06/19/18 0326  WBC 7.8 7.7  RBC 4.27 3.93*  HGB 12.9* 11.9*  HCT 40.4 36.9*  MCV 94.6 93.9  MCH 30.2 30.3  MCHC 31.9 32.2  RDW 13.1 13.1  PLT 294 249    Cardiac Enzymes Recent Labs  Lab 06/18/18 2232 06/19/18 0326  TROPONINI 3.71* 11.84*   No results for input(s): TROPIPOC in the last 168 hours.   BNP Recent Labs  Lab 06/18/18 2232  BNP 74.9     DDimer No results for input(s): DDIMER in the last 168 hours.   Radiology    No results found.  Cardiac Studies   Cardiac cath 06/18/18:  Prox RCA to Mid RCA lesion is 75% stenosed.  RPDA lesion is 100% stenosed.  Mid RCA lesion is 50% stenosed.  A drug-eluting stent was successfully placed using a STENT SIERRA 3.50 X 18 MM.  Post intervention, there is a 0% residual stenosis.  Ost LAD to Prox LAD lesion is 70% stenosed.  Mid LAD lesion is 40% stenosed.  Prox Cx to Mid Cx lesion is 50% stenosed.  The left ventricular ejection fraction is 45-50% by visual estimate.   1.  Severe hypodense stenosis in the mid RCA suggestive of the patient's culprit lesion with total occlusion of the distal PDA likely related to atheroembolism from a more proximal plaque 2.  Diffuse coronary ectasia with moderately severe proximal LAD stenosis, moderate mid circumflex stenosis, and otherwise diffuse nonobstructive disease 3.  Mild segmental LV systolic dysfunction with severe hypokinesis of the basal and mid inferior wall, and mildly reduced LV systolic function with an ejection fraction estimated at 45 to 50% 4.  Successful PCI of the mid RCA with primary stenting using a 3.5 x 18 mm Sierra DES postdilated with a 4.0 mm noncompliant balloon  Recommendations: Post MI medical therapy with aggressive risk reduction measures, uninterrupted dual antiplatelet therapy with aspirin and ticagrelor for  12 months, consideration of staged PCI of the proximal LAD.  Will review films with colleagues as the proximal LAD is not critically tight, but the lesion appears ulcerated and stenting of a potentially unstable plaque might be considered.     Patient Profile     57 y.o. male with history of HTN admitted with an acute inferoposterior MI on 06/18/18. A drug eluting stent was placed in the mid RCA. Small distal PDA occluded from distal embolization.   Assessment & Plan    1. CAD/Acute inferoposterior STEMI: Pt admitted 06/18/18 with an acute MI. A drug eluting stent was placed in the mid RCA. Small distal PDA occluded from distal embolization. Residual ulcerated moderately severe proximal LAD stenosis. He is stable this am. Mild chest pain resolved with IV NTG. Short runs of NSVT overnight.  Will continue DAPT for one year with ASA and Brilinta.  Will continue high intensity statin and beta blocker.  Will plan staged PCI of the ulcerate proximal LAD lesion on Monday 06/22/18.  Echo today.  Start Imdur. Wean IV NTG  2. NSVT: continue beta blocker.   For questions or updates, please contact [place] Please consult [email or web address] for contact info under Cardiology/STEMI.      Signed, Lauree Chandler, MD  06/19/2018, 8:03 AM

## 2018-06-19 NOTE — Progress Notes (Signed)
#  2. S/W CASHEEN @ [facility] # [phone]    BRLINITA  90 MG  BID  COVER- YES  CO-PAY- ZERO DOLLARS  TIER- NO  PRIOR APPROVAL- NO   PREFERRED PHARMACY : YES [facility]  90 DAY SUPPLY AT RETAIL ALSO ZERO DOLLARS

## 2018-06-19 NOTE — Progress Notes (Signed)
Patient continues to refuse bed alarm. Will continue to monitor patient. Reminded to call for assistance any time.

## 2018-06-19 NOTE — Progress Notes (Signed)
Pt c/o persistent chest pain. MD made aware. New orders received. PRN EKG performed.

## 2018-06-19 NOTE — Progress Notes (Signed)
CARDIAC REHAB PHASE I   PRE:  Rate/Rhythm: 84 SR  BP:  Supine: 139/77  Sitting:   Standing:    SaO2:   MODE:  Ambulation: 370 ft   POST:  Rate/Rhythm: 84 SR  BP:  Supine:   Sitting: 139/93  Standing:    SaO2: 97%RA 1110-1210 Pt walked 370 ft on RA with asst x 1 due to equipment. Chest pressure at 1 to 2 on scale that did not worsen with activity. To bathroom twice. Pt is impulsive when he has to go to bathroom. Began ed. Gave MI booklet and reviewed restrictions. Reviewed NTG use, MI restrictions, stent and importance of brililnta, smoking cessation. Gave fake cigarette and smoking cessation handout. Left heart healthy diet and pt will need continued ed. Had fast food brought in by wife. Left written materials for wife and pt for review. Will follow up tomorrow for continued ed.   Graylon Good, RN BSN  06/19/2018 12:05 PM
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
## 2018-06-19 NOTE — Progress Notes (Signed)
  Echocardiogram 2D Echocardiogram has been performed.  Madelaine Etienne 06/19/2018, 9:50 AM

## 2018-06-19 NOTE — Progress Notes (Signed)
CRITICAL VALUE ALERT  Critical Value:  Troponin 3.71  Date & Time Notied:  06/19/18 @ 0005  Provider Notified: MD not notified. Expected result post cath and stent  Orders Received/Actions taken:

## 2018-06-19 NOTE — Care Management Note (Signed)
Case Management Note  Patient Details  Name: Jeremy Raymond MRN: [id number] Date of Birth: [birth year]  Subjective/Objective:   From home with wife, s/p stent intervention, NCM gave wife the brilinta 30 day free coupon card, and informed her of the zero dollar co pay.  Patient will have a staged PCI on 6/24 also.                   Action/Plan: NCM will follow for dc needs.   Expected Discharge Date:                  Expected Discharge Plan:  Home/Self Care  In-House Referral:     Discharge planning Services  CM Consult, Medication Assistance  Post Acute Care Choice:    Choice offered to:     DME Arranged:    DME Agency:     HH Arranged:    HH Agency:     Status of Service:  In process, will continue to follow  If discussed at Long Length of Stay Meetings, dates discussed:    Additional Comments:  Zenon Mayo, RN 06/19/2018, 3:32 PM

## 2018-06-20 LAB — BASIC METABOLIC PANEL
ANION GAP: 7 (ref 5–15)
BUN: 8 mg/dL (ref 6–20)
CALCIUM: 9.3 mg/dL (ref 8.9–10.3)
CO2: 26 mmol/L (ref 22–32)
CREATININE: 0.8 mg/dL (ref 0.61–1.24)
Chloride: 104 mmol/L (ref 101–111)
Glucose, Bld: 100 mg/dL — ABNORMAL HIGH (ref 65–99)
Potassium: 4.1 mmol/L (ref 3.5–5.1)
SODIUM: 137 mmol/L (ref 135–145)

## 2018-06-20 LAB — CBC
HEMATOCRIT: 36.4 % — AB (ref 39.0–52.0)
Hemoglobin: 11.9 g/dL — ABNORMAL LOW (ref 13.0–17.0)
MCH: 30.3 pg (ref 26.0–34.0)
MCHC: 32.7 g/dL (ref 30.0–36.0)
MCV: 92.6 fL (ref 78.0–100.0)
PLATELETS: 270 10*3/uL (ref 150–400)
RBC: 3.93 MIL/uL — ABNORMAL LOW (ref 4.22–5.81)
RDW: 13.1 % (ref 11.5–15.5)
WBC: 5.3 10*3/uL (ref 4.0–10.5)

## 2018-06-20 NOTE — Progress Notes (Signed)
Jeremy Raymond aware of pt low blood pressure, no new orders at this time  Stated to call if pt becomes symptomatic  Will continue to monitor

## 2018-06-20 NOTE — Progress Notes (Signed)
Patient continues to refuse bed alarm. Wife @ bedside. Will continue to monitor patient closely.

## 2018-06-20 NOTE — Progress Notes (Signed)
Pt blood pressure rechecked manually. Pt denies shortness of breath, pain, dizziness, clamminess. Pt states he has been sleeping on his side. Educated pt to call if he feels any symptoms. Will continue to monitor

## 2018-06-20 NOTE — Progress Notes (Signed)
Progress Note  Patient Name: Jeremy Raymond Date of Encounter: 06/20/2018  Primary Cardiologist: No primary care provider on file.   Subjective   No complaints.  Inpatient Medications    Scheduled Meds: . aspirin  324 mg Oral Once  . aspirin EC  81 mg Oral Daily  . atorvastatin  80 mg Oral q1800  . carvedilol  6.25 mg Oral BID WC  . heparin  5,000 Units Subcutaneous Q8H  . isosorbide mononitrate  30 mg Oral Daily  . lisinopril  10 mg Oral Daily  . sodium chloride flush  3 mL Intravenous Q12H  . ticagrelor  90 mg Oral BID   Continuous Infusions: . sodium chloride Stopped (06/18/18 2215)  . sodium chloride     PRN Meds: sodium chloride, acetaminophen, gi cocktail, morphine injection, nitroGLYCERIN, nitroGLYCERIN, ondansetron (ZOFRAN) IV, sodium chloride flush, traMADol   Vital Signs    Vitals:   06/19/18 2037 06/20/18 0500 06/20/18 1154 06/20/18 1205  BP: 110/69 110/83 (!) 77/65 (!) 92/56  Pulse: 79 81 76   Resp: 16 16    Temp: 98.6 F (37 C) 98.2 F (36.8 C) 99.1 F (37.3 C)   TempSrc: Oral Oral Oral   SpO2: 96% 98% 98%   Weight:  229 lb 6.4 oz (104.1 kg)    Height:        Intake/Output Summary (Last 24 hours) at 06/20/2018 1211 Last data filed at 06/20/2018 0751 Gross per 24 hour  Intake 660 ml  Output 825 ml  Net -165 ml   Filed Weights   06/18/18 1912 06/19/18 1525 06/20/18 0500  Weight: 240 lb (108.9 kg) 234 lb 6.4 oz (106.3 kg) 229 lb 6.4 oz (104.1 kg)    Telemetry    NSR. No new NSVT - Personally Reviewed  ECG    NSR, inc RBBB, inferior Q waves, subtle nonspecific widespread ST elevation - Personally Reviewed  Physical Exam  Obese GEN: No acute distress.   Neck: No JVD Cardiac: RRR, no murmurs, rubs, or gallops. Healthy right radial access site.  Respiratory: Clear to auscultation bilaterally. GI: Soft, nontender, non-distended  MS: No edema; No deformity. S/P extensive surgery in pubic area Neuro:  Nonfocal  Psych: Normal affect    Labs    Chemistry Recent Labs  Lab 06/18/18 2232 06/19/18 0326 06/20/18 0119  NA 134* 134* 137  K 4.5 3.9 4.1  CL 99* 102 104  CO2 25 23 26   GLUCOSE 116* 116* 100*  BUN 10 8 8   CREATININE 0.92 0.78 0.80  CALCIUM 9.1 8.9 9.3  PROT 6.4*  --   --   ALBUMIN 3.0*  --   --   AST 49*  --   --   ALT 34  --   --   ALKPHOS 135*  --   --   BILITOT 0.2*  --   --   GFRNONAA >60 >60 >60  GFRAA >60 >60 >60  ANIONGAP 10 9 7      Hematology Recent Labs  Lab 06/18/18 2232 06/19/18 0326 06/20/18 0119  WBC 7.8 7.7 5.3  RBC 4.27 3.93* 3.93*  HGB 12.9* 11.9* 11.9*  HCT 40.4 36.9* 36.4*  MCV 94.6 93.9 92.6  MCH 30.2 30.3 30.3  MCHC 31.9 32.2 32.7  RDW 13.1 13.1 13.1  PLT 294 249 270    Cardiac Enzymes Recent Labs  Lab 06/18/18 2232 06/19/18 0326 06/19/18 1036  TROPONINI 3.71* 11.84* 16.51*   No results for input(s): TROPIPOC in the last 168 hours.  BNP Recent Labs  Lab 06/18/18 2232  BNP 74.9     DDimer No results for input(s): DDIMER in the last 168 hours.   Radiology    No results found.  Cardiac Studies    Cardiac cath 06/18/18:  Prox RCA to Mid RCA lesion is 75% stenosed.  RPDA lesion is 100% stenosed.  Mid RCA lesion is 50% stenosed.  A drug-eluting stent was successfully placed using a STENT SIERRA 3.50 X 18 MM.  Post intervention, there is a 0% residual stenosis.  Ost LAD to Prox LAD lesion is 70% stenosed.  Mid LAD lesion is 40% stenosed.  Prox Cx to Mid Cx lesion is 50% stenosed.  The left ventricular ejection fraction is 45-50% by visual estimate.  1. Severe hypodense stenosis in the mid RCA suggestive of the patient's culprit lesion with total occlusion of the distal PDA likely related to atheroembolism from a more proximal plaque 2. Diffuse coronary ectasia with moderately severe proximal LAD stenosis, moderate mid circumflex stenosis, and otherwise diffuse nonobstructive disease 3. Mild segmental LV systolic dysfunction with  severe hypokinesis of the basal and mid inferior wall, and mildly reduced LV systolic function with an ejection fraction estimated at 45 to 50% 4. Successful PCI of the mid RCA with primary stenting using a 3.5 x 18 mm Sierra DES postdilated with a 4.0 mm noncompliant balloon  Recommendations: Post MI medical therapy with aggressive risk reduction measures, uninterrupted dual antiplatelet therapy with aspirin and ticagrelor for 12 months, consideration of staged PCI of the proximal LAD. Will review films with colleagues as the proximal LAD is not critically tight, but the lesion appears ulcerated and stenting of a potentially unstable plaque might be considered.     Patient Profile     57 y.o. male with history of HTN admitted with an acute inferoposterior MI on 06/18/18. A drug eluting stent was placed in the mid RCA. Small distal PDA occluded from distal embolization. Plan for staged PCI of ulcerated LAD lesion on Monday 6/24.  Assessment & Plan    1. CAD s/p inferoposterior STEMI:  Improved chest pain. Has ulcerated LAD stenosis, plan for PCI Monday 2. Hypotension:  Precludes higher beta blocker dose. 3. HLP: on statin, at target LDL, HDL is low.  For questions or updates, please contact [place] Please consult [email or web address] for contact info under Cardiology/STEMI.      Signed, Sanda Klein, MD  06/20/2018, 12:11 PM

## 2018-06-20 NOTE — Plan of Care (Signed)
  Problem: Activity: Goal: Risk for activity intolerance will decrease Outcome: Completed/Met   Problem: Nutrition: Goal: Adequate nutrition will be maintained Outcome: Completed/Met   Problem: Coping: Goal: Level of anxiety will decrease Outcome: Completed/Met   Problem: Elimination: Goal: Will not experience complications related to bowel motility Outcome: Completed/Met   Problem: Safety: Goal: Ability to remain free from injury will improve Outcome: Completed/Met   Problem: Activity: Goal: Ability to tolerate increased activity will improve Outcome: Completed/Met

## 2018-06-20 NOTE — Progress Notes (Signed)
CARDIAC REHAB PHASE I   PRE:  Rate/Rhythm: 70 SR  BP:  Supine: 92/56 manual    SaO2: 98% RA  MODE:  Ambulation: 470 ft   POST:  Rate/Rhythm: 86 SR  BP:  Sitting: 118/64 manual    SaO2: 97% RA  1210-1230 Pt ambulated 470 ft with steady gait. No SOB or chest pain or pressure.  Cardiologist into room after walk.   Noel Christmas, RN 06/20/2018 12:28 PM

## 2018-06-21 MED ORDER — SODIUM CHLORIDE 0.9% FLUSH
3.0000 mL | Freq: Two times a day (BID) | INTRAVENOUS | Status: DC
  Administered 2018-06-21: 3 mL via INTRAVENOUS

## 2018-06-21 MED ORDER — LISINOPRIL 5 MG PO TABS
5.0000 mg | ORAL_TABLET | Freq: Every day | ORAL | Status: DC
  Administered 2018-06-22 – 2018-06-23 (×2): 5 mg via ORAL
  Filled 2018-06-21 (×2): qty 1

## 2018-06-21 MED ORDER — SODIUM CHLORIDE 0.9 % WEIGHT BASED INFUSION
1.0000 mL/kg/h | INTRAVENOUS | Status: DC
  Administered 2018-06-22: 250 mL via INTRAVENOUS
  Administered 2018-06-22: 1 mL/kg/h via INTRAVENOUS

## 2018-06-21 MED ORDER — ASPIRIN 81 MG PO CHEW
81.0000 mg | CHEWABLE_TABLET | ORAL | Status: AC
  Administered 2018-06-22: 81 mg via ORAL
  Filled 2018-06-21: qty 1

## 2018-06-21 MED ORDER — SODIUM CHLORIDE 0.9 % IV SOLN
250.0000 mL | INTRAVENOUS | Status: DC | PRN
Start: 2018-06-21 — End: 2018-06-22

## 2018-06-21 MED ORDER — SODIUM CHLORIDE 0.9% FLUSH
3.0000 mL | INTRAVENOUS | Status: DC | PRN
Start: 2018-06-21 — End: 2018-06-22

## 2018-06-21 NOTE — Plan of Care (Signed)
  Problem: Skin Integrity: Goal: Risk for impaired skin integrity will decrease Outcome: Completed/Met   Problem: Cardiac: Goal: Ability to achieve and maintain adequate cardiopulmonary perfusion will improve Outcome: Completed/Met

## 2018-06-21 NOTE — H&P (View-Only) (Signed)
Progress Note  Patient Name: Jeremy Raymond Date of Encounter: 06/21/2018  Primary Cardiologist: Sherren Mocha, MD   Subjective   No chest pain, no shortness of breath, no syncope.  He was able to walk the hallway without difficulty.  When he walked his blood pressure increased.  No dizziness.  No syncope.  Inpatient Medications    Scheduled Meds: . aspirin  324 mg Oral Once  . aspirin EC  81 mg Oral Daily  . atorvastatin  80 mg Oral q1800  . carvedilol  6.25 mg Oral BID WC  . heparin  5,000 Units Subcutaneous Q8H  . isosorbide mononitrate  30 mg Oral Daily  . lisinopril  10 mg Oral Daily  . sodium chloride flush  3 mL Intravenous Q12H  . ticagrelor  90 mg Oral BID   Continuous Infusions: . sodium chloride Stopped (06/18/18 2215)  . sodium chloride     PRN Meds: sodium chloride, acetaminophen, gi cocktail, morphine injection, nitroGLYCERIN, nitroGLYCERIN, ondansetron (ZOFRAN) IV, sodium chloride flush, traMADol   Vital Signs    Vitals:   06/20/18 2106 06/21/18 0616 06/21/18 0803 06/21/18 1226  BP: (!) 92/57 100/60  (!) 87/62  Pulse: 75 70  66  Resp: 18 18  20   Temp: 98 F (36.7 C) 98.1 F (36.7 C)  98.2 F (36.8 C)  TempSrc: Oral Oral  Oral  SpO2: 98% 93%  96%  Weight:   228 lb 9.6 oz (103.7 kg)   Height:        Intake/Output Summary (Last 24 hours) at 06/21/2018 1239 Last data filed at 06/21/2018 1111 Gross per 24 hour  Intake 820 ml  Output 700 ml  Net 120 ml   Filed Weights   06/19/18 1525 06/20/18 0500 06/21/18 0803  Weight: 234 lb 6.4 oz (106.3 kg) 229 lb 6.4 oz (104.1 kg) 228 lb 9.6 oz (103.7 kg)    Telemetry    Normal sinus rhythm/sinus bradycardia at night with brief paroxysmal atrial tachycardia approximately 15 beats, no ventricular tachycardia.- Personally Reviewed  ECG    No new- Personally Reviewed  Physical Exam   GEN: No acute distress.   Neck: No JVD Cardiac: RRR, no murmurs, rubs, or gallops.  Respiratory: Clear to  auscultation bilaterally. GI: Soft, nontender, non-distended  MS: No edema; No deformity. Neuro:  Nonfocal  Psych: Normal affect   Labs    Chemistry Recent Labs  Lab 06/18/18 2232 06/19/18 0326 06/20/18 0119  NA 134* 134* 137  K 4.5 3.9 4.1  CL 99* 102 104  CO2 25 23 26   GLUCOSE 116* 116* 100*  BUN 10 8 8   CREATININE 0.92 0.78 0.80  CALCIUM 9.1 8.9 9.3  PROT 6.4*  --   --   ALBUMIN 3.0*  --   --   AST 49*  --   --   ALT 34  --   --   ALKPHOS 135*  --   --   BILITOT 0.2*  --   --   GFRNONAA >60 >60 >60  GFRAA >60 >60 >60  ANIONGAP 10 9 7      Hematology Recent Labs  Lab 06/18/18 2232 06/19/18 0326 06/20/18 0119  WBC 7.8 7.7 5.3  RBC 4.27 3.93* 3.93*  HGB 12.9* 11.9* 11.9*  HCT 40.4 36.9* 36.4*  MCV 94.6 93.9 92.6  MCH 30.2 30.3 30.3  MCHC 31.9 32.2 32.7  RDW 13.1 13.1 13.1  PLT 294 249 270    Cardiac Enzymes Recent Labs  Lab 06/18/18 2232  06/19/18 0326 06/19/18 1036  TROPONINI 3.71* 11.84* 16.51*   No results for input(s): TROPIPOC in the last 168 hours.   BNP Recent Labs  Lab 06/18/18 2232  BNP 74.9     DDimer No results for input(s): DDIMER in the last 168 hours.   Radiology    No results found.  Cardiac Studies    Cardiac cath 06/18/18:  Prox RCA to Mid RCA lesion is 75% stenosed.  RPDA lesion is 100% stenosed.  Mid RCA lesion is 50% stenosed.  A drug-eluting stent was successfully placed using a STENT SIERRA 3.50 X 18 MM.  Post intervention, there is a 0% residual stenosis.  Ost LAD to Prox LAD lesion is 70% stenosed.  Mid LAD lesion is 40% stenosed.  Prox Cx to Mid Cx lesion is 50% stenosed.  The left ventricular ejection fraction is 45-50% by visual estimate.  1. Severe hypodense stenosis in the mid RCA suggestive of the patient's culprit lesion with total occlusion of the distal PDA likely related to atheroembolism from a more proximal plaque 2. Diffuse coronary ectasia with moderately severe proximal LAD stenosis,  moderate mid circumflex stenosis, and otherwise diffuse nonobstructive disease 3. Mild segmental LV systolic dysfunction with severe hypokinesis of the basal and mid inferior wall, and mildly reduced LV systolic function with an ejection fraction estimated at 45 to 50% 4. Successful PCI of the mid RCA with primary stenting using a 3.5 x 18 mm Sierra DES postdilated with a 4.0 mm noncompliant balloon  Recommendations: Post MI medical therapy with aggressive risk reduction measures, uninterrupted dual antiplatelet therapy with aspirin and ticagrelor for 12 months, consideration of staged PCI of the proximal LAD. Will review films with colleagues as the proximal LAD is not critically tight, but the lesion appears ulcerated and stenting of a potentially unstable plaque might be considered.  Echocardiogram 06/19/2018: - Left ventricle: The cavity size was normal. Wall thickness was   normal. Systolic function was normal. The estimated ejection   fraction was in the range of 55% to 60%. There is hypokinesis of   the basal-midinferoseptal myocardium. Doppler parameters are   consistent with abnormal left ventricular relaxation (grade 1   diastolic dysfunction). - Right ventricle: The cavity size was mildly dilated. Wall   thickness was normal.  Patient Profile     57 y.o. male with acute inferior posterior myocardial infarction, DES to mid RCA, distal PDA occluded from embolization with plans for PCI of LAD ulcerated lesion Monday 6/24.  Hypotension noted.  Assessment & Plan    ST segment elevation myocardial infarction inferior posterior - Plan for LAD intervention tomorrow. - Mild hypertension noted.  I will pull back on lisinopril to 5 mg.-Keep carvedilol the same.  He did have brief paroxysmal atrial tachycardia noted on telemetry.  Hypotension -As above.  Decreasing antihypertensives.  Hyperlipidemia - On high intensity statin therapy.    Obesity -Continue to encourage weight  loss.  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  For questions or updates, please contact [place] Please consult [email or web address] for contact info under Cardiology/STEMI.      Signed, Candee Furbish, MD  06/21/2018, 12:39 PM

## 2018-06-21 NOTE — Progress Notes (Signed)
Patient has been resting in bed this shift with wife at bedside. Meds taken without incidence.  Call bell at side, no distress noted. No c/o voiced.

## 2018-06-21 NOTE — Progress Notes (Signed)
Progress Note  Patient Name: Jeremy Raymond Date of Encounter: 06/21/2018  Primary Cardiologist: Sherren Mocha, MD   Subjective   No chest pain, no shortness of breath, no syncope.  He was able to walk the hallway without difficulty.  When he walked his blood pressure increased.  No dizziness.  No syncope.  Inpatient Medications    Scheduled Meds: . aspirin  324 mg Oral Once  . aspirin EC  81 mg Oral Daily  . atorvastatin  80 mg Oral q1800  . carvedilol  6.25 mg Oral BID WC  . heparin  5,000 Units Subcutaneous Q8H  . isosorbide mononitrate  30 mg Oral Daily  . lisinopril  10 mg Oral Daily  . sodium chloride flush  3 mL Intravenous Q12H  . ticagrelor  90 mg Oral BID   Continuous Infusions: . sodium chloride Stopped (06/18/18 2215)  . sodium chloride     PRN Meds: sodium chloride, acetaminophen, gi cocktail, morphine injection, nitroGLYCERIN, nitroGLYCERIN, ondansetron (ZOFRAN) IV, sodium chloride flush, traMADol   Vital Signs    Vitals:   06/20/18 2106 06/21/18 0616 06/21/18 0803 06/21/18 1226  BP: (!) 92/57 100/60  (!) 87/62  Pulse: 75 70  66  Resp: 18 18  20   Temp: 98 F (36.7 C) 98.1 F (36.7 C)  98.2 F (36.8 C)  TempSrc: Oral Oral  Oral  SpO2: 98% 93%  96%  Weight:   228 lb 9.6 oz (103.7 kg)   Height:        Intake/Output Summary (Last 24 hours) at 06/21/2018 1239 Last data filed at 06/21/2018 1111 Gross per 24 hour  Intake 820 ml  Output 700 ml  Net 120 ml   Filed Weights   06/19/18 1525 06/20/18 0500 06/21/18 0803  Weight: 234 lb 6.4 oz (106.3 kg) 229 lb 6.4 oz (104.1 kg) 228 lb 9.6 oz (103.7 kg)    Telemetry    Normal sinus rhythm/sinus bradycardia at night with brief paroxysmal atrial tachycardia approximately 15 beats, no ventricular tachycardia.- Personally Reviewed  ECG    No new- Personally Reviewed  Physical Exam   GEN: No acute distress.   Neck: No JVD Cardiac: RRR, no murmurs, rubs, or gallops.  Respiratory: Clear to  auscultation bilaterally. GI: Soft, nontender, non-distended  MS: No edema; No deformity. Neuro:  Nonfocal  Psych: Normal affect   Labs    Chemistry Recent Labs  Lab 06/18/18 2232 06/19/18 0326 06/20/18 0119  NA 134* 134* 137  K 4.5 3.9 4.1  CL 99* 102 104  CO2 25 23 26   GLUCOSE 116* 116* 100*  BUN 10 8 8   CREATININE 0.92 0.78 0.80  CALCIUM 9.1 8.9 9.3  PROT 6.4*  --   --   ALBUMIN 3.0*  --   --   AST 49*  --   --   ALT 34  --   --   ALKPHOS 135*  --   --   BILITOT 0.2*  --   --   GFRNONAA >60 >60 >60  GFRAA >60 >60 >60  ANIONGAP 10 9 7      Hematology Recent Labs  Lab 06/18/18 2232 06/19/18 0326 06/20/18 0119  WBC 7.8 7.7 5.3  RBC 4.27 3.93* 3.93*  HGB 12.9* 11.9* 11.9*  HCT 40.4 36.9* 36.4*  MCV 94.6 93.9 92.6  MCH 30.2 30.3 30.3  MCHC 31.9 32.2 32.7  RDW 13.1 13.1 13.1  PLT 294 249 270    Cardiac Enzymes Recent Labs  Lab 06/18/18 2232  06/19/18 0326 06/19/18 1036  TROPONINI 3.71* 11.84* 16.51*   No results for input(s): TROPIPOC in the last 168 hours.   BNP Recent Labs  Lab 06/18/18 2232  BNP 74.9     DDimer No results for input(s): DDIMER in the last 168 hours.   Radiology    No results found.  Cardiac Studies    Cardiac cath 06/18/18:  Prox RCA to Mid RCA lesion is 75% stenosed.  RPDA lesion is 100% stenosed.  Mid RCA lesion is 50% stenosed.  A drug-eluting stent was successfully placed using a STENT SIERRA 3.50 X 18 MM.  Post intervention, there is a 0% residual stenosis.  Ost LAD to Prox LAD lesion is 70% stenosed.  Mid LAD lesion is 40% stenosed.  Prox Cx to Mid Cx lesion is 50% stenosed.  The left ventricular ejection fraction is 45-50% by visual estimate.  1. Severe hypodense stenosis in the mid RCA suggestive of the patient's culprit lesion with total occlusion of the distal PDA likely related to atheroembolism from a more proximal plaque 2. Diffuse coronary ectasia with moderately severe proximal LAD stenosis,  moderate mid circumflex stenosis, and otherwise diffuse nonobstructive disease 3. Mild segmental LV systolic dysfunction with severe hypokinesis of the basal and mid inferior wall, and mildly reduced LV systolic function with an ejection fraction estimated at 45 to 50% 4. Successful PCI of the mid RCA with primary stenting using a 3.5 x 18 mm Sierra DES postdilated with a 4.0 mm noncompliant balloon  Recommendations: Post MI medical therapy with aggressive risk reduction measures, uninterrupted dual antiplatelet therapy with aspirin and ticagrelor for 12 months, consideration of staged PCI of the proximal LAD. Will review films with colleagues as the proximal LAD is not critically tight, but the lesion appears ulcerated and stenting of a potentially unstable plaque might be considered.  Echocardiogram 06/19/2018: - Left ventricle: The cavity size was normal. Wall thickness was   normal. Systolic function was normal. The estimated ejection   fraction was in the range of 55% to 60%. There is hypokinesis of   the basal-midinferoseptal myocardium. Doppler parameters are   consistent with abnormal left ventricular relaxation (grade 1   diastolic dysfunction). - Right ventricle: The cavity size was mildly dilated. Wall   thickness was normal.  Patient Profile     57 y.o. male with acute inferior posterior myocardial infarction, DES to mid RCA, distal PDA occluded from embolization with plans for PCI of LAD ulcerated lesion Monday 6/24.  Hypotension noted.  Assessment & Plan    ST segment elevation myocardial infarction inferior posterior - Plan for LAD intervention tomorrow. - Mild hypertension noted.  I will pull back on lisinopril to 5 mg.-Keep carvedilol the same.  He did have brief paroxysmal atrial tachycardia noted on telemetry.  Hypotension -As above.  Decreasing antihypertensives.  Hyperlipidemia - On high intensity statin therapy.    Obesity -Continue to encourage weight  loss.  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  For questions or updates, please contact [place] Please consult [email or web address] for contact info under Cardiology/STEMI.      Signed, Candee Furbish, MD  06/21/2018, 12:39 PM

## 2018-06-22 ENCOUNTER — Encounter (HOSPITAL_COMMUNITY)
Admission: EM | Disposition: A | Discharge status: Home / Self Care | Payer: Self-pay | Source: Other Acute Inpatient Hospital | Attending: Cardiovascular Disease

## 2018-06-22 DIAGNOSIS — I2510 Atherosclerotic heart disease of native coronary artery without angina pectoris: Secondary | ICD-10-CM

## 2018-06-22 HISTORY — PX: CORONARY STENT INTERVENTION: CATH118234

## 2018-06-22 HISTORY — PX: LEFT HEART CATH AND CORONARY ANGIOGRAPHY: CATH118249

## 2018-06-22 HISTORY — PX: INTRAVASCULAR ULTRASOUND/IVUS: CATH118244

## 2018-06-22 LAB — CBC
HEMATOCRIT: 40.1 % (ref 39.0–52.0)
HEMOGLOBIN: 12.4 g/dL — AB (ref 13.0–17.0)
MCH: 30.2 pg (ref 26.0–34.0)
MCHC: 30.9 g/dL (ref 30.0–36.0)
MCV: 97.6 fL (ref 78.0–100.0)
Platelets: 281 10*3/uL (ref 150–400)
RBC: 4.11 MIL/uL — AB (ref 4.22–5.81)
RDW: 13 % (ref 11.5–15.5)
WBC: 5.5 10*3/uL (ref 4.0–10.5)

## 2018-06-22 LAB — CREATININE, SERUM
Creatinine, Ser: 0.95 mg/dL (ref 0.61–1.24)
GFR calc Af Amer: >60 mL/min (ref >60)

## 2018-06-22 LAB — POCT ACTIVATED CLOTTING TIME
ACTIVATED CLOTTING TIME: 274 s
ACTIVATED CLOTTING TIME: 274 s

## 2018-06-22 SURGERY — CORONARY STENT INTERVENTION
Anesthesia: LOCAL

## 2018-06-22 MED ORDER — NITROGLYCERIN 1 MG/10 ML FOR IR/CATH LAB
INTRA_ARTERIAL | Status: AC
  Filled 2018-06-22: qty 10

## 2018-06-22 MED ORDER — IOPAMIDOL (ISOVUE-370) INJECTION 76%
INTRAVENOUS | Status: AC
  Filled 2018-06-22: qty 125

## 2018-06-22 MED ORDER — FENTANYL CITRATE (PF) 100 MCG/2ML IJ SOLN
INTRAMUSCULAR | Status: AC
  Filled 2018-06-22: qty 2

## 2018-06-22 MED ORDER — LIDOCAINE HCL (PF) 1 % IJ SOLN
INTRAMUSCULAR | Status: AC
  Filled 2018-06-22: qty 30

## 2018-06-22 MED ORDER — HEPARIN SODIUM (PORCINE) 5000 UNIT/ML IJ SOLN
5000.0000 [IU] | Freq: Three times a day (TID) | INTRAMUSCULAR | Status: DC
  Administered 2018-06-22 – 2018-06-23 (×3): 5000 [IU] via SUBCUTANEOUS
  Filled 2018-06-22 (×2): qty 1

## 2018-06-22 MED ORDER — LIDOCAINE HCL (PF) 1 % IJ SOLN
INTRAMUSCULAR | Status: DC | PRN
  Administered 2018-06-22: 2 mL

## 2018-06-22 MED ORDER — SODIUM CHLORIDE 0.9% FLUSH: 3.0000 mL | INTRAVENOUS | Status: DC | PRN

## 2018-06-22 MED ORDER — MIDAZOLAM HCL 2 MG/2ML IJ SOLN
INTRAMUSCULAR | Status: DC | PRN
  Administered 2018-06-22 (×2): 1 mg via INTRAVENOUS

## 2018-06-22 MED ORDER — MIDAZOLAM HCL 2 MG/2ML IJ SOLN
INTRAMUSCULAR | Status: AC
  Filled 2018-06-22: qty 2

## 2018-06-22 MED ORDER — IOHEXOL 350 MG/ML SOLN
INTRAVENOUS | Status: DC | PRN
  Administered 2018-06-22: 5 mL

## 2018-06-22 MED ORDER — HEPARIN SODIUM (PORCINE) 1000 UNIT/ML IJ SOLN
INTRAMUSCULAR | Status: AC
  Filled 2018-06-22: qty 1

## 2018-06-22 MED ORDER — VERAPAMIL HCL 2.5 MG/ML IV SOLN
INTRAVENOUS | Status: DC | PRN
  Administered 2018-06-22: 10 mL via INTRA_ARTERIAL

## 2018-06-22 MED ORDER — SODIUM CHLORIDE 0.9 % IV SOLN: 250.0000 mL | INTRAVENOUS | Status: DC | PRN

## 2018-06-22 MED ORDER — HEPARIN SODIUM (PORCINE) 1000 UNIT/ML IJ SOLN
INTRAMUSCULAR | Status: DC | PRN
  Administered 2018-06-22: 2000 [IU] via INTRAVENOUS
  Administered 2018-06-22: 9000 [IU] via INTRAVENOUS
  Administered 2018-06-22: 2000 [IU] via INTRAVENOUS

## 2018-06-22 MED ORDER — IOPAMIDOL (ISOVUE-370) INJECTION 76%
INTRAVENOUS | Status: DC | PRN
  Administered 2018-06-22: 115 mL

## 2018-06-22 MED ORDER — FENTANYL CITRATE (PF) 100 MCG/2ML IJ SOLN
INTRAMUSCULAR | Status: DC | PRN
  Administered 2018-06-22 (×3): 25 ug via INTRAVENOUS

## 2018-06-22 MED ORDER — HEPARIN (PORCINE) IN NACL 2-0.9 UNITS/ML
INTRAMUSCULAR | Status: AC | PRN
  Administered 2018-06-22 (×2): 500 mL

## 2018-06-22 MED ORDER — LABETALOL HCL 5 MG/ML IV SOLN: 10.0000 mg | INTRAVENOUS | Status: AC | PRN

## 2018-06-22 MED ORDER — SODIUM CHLORIDE 0.9 % IV SOLN
INTRAVENOUS | Status: AC
  Administered 2018-06-22: 11:00:00 via INTRAVENOUS

## 2018-06-22 MED ORDER — HYDRALAZINE HCL 20 MG/ML IJ SOLN: 5.0000 mg | INTRAMUSCULAR | Status: AC | PRN

## 2018-06-22 MED ORDER — VERAPAMIL HCL 2.5 MG/ML IV SOLN
INTRAVENOUS | Status: AC
  Filled 2018-06-22: qty 2

## 2018-06-22 MED ORDER — ANGIOPLASTY BOOK
Freq: Once | Status: AC
  Administered 2018-06-22: 22:00:00
  Filled 2018-06-22: qty 1

## 2018-06-22 MED ORDER — SODIUM CHLORIDE 0.9% FLUSH
3.0000 mL | Freq: Two times a day (BID) | INTRAVENOUS | Status: DC
  Administered 2018-06-22: 3 mL via INTRAVENOUS

## 2018-06-22 MED ORDER — NITROGLYCERIN 1 MG/10 ML FOR IR/CATH LAB
INTRA_ARTERIAL | Status: DC | PRN
  Administered 2018-06-22: 200 ug via INTRACORONARY

## 2018-06-22 SURGICAL SUPPLY — 20 items
BALLN SAPPHIRE ~~LOC~~ 4.0X8 (BALLOONS) ×2 IMPLANT
BALLN WOLVERINE 3.50X10 (BALLOONS) ×2
BALLOON WOLVERINE 3.50X10 (BALLOONS) ×1 IMPLANT
CATH INFINITI JR4 5F (CATHETERS) ×2 IMPLANT
CATH LAUNCHER 6FR EBU3.5 (CATHETERS) ×2 IMPLANT
CATH OPTICROSS 40MHZ (CATHETERS) ×2 IMPLANT
COVER PRB 48X5XTLSCP FOLD TPE (BAG) ×1 IMPLANT
COVER PROBE 5X48 (BAG) ×1
DEVICE RAD COMP TR BAND LRG (VASCULAR PRODUCTS) ×2 IMPLANT
GLIDESHEATH SLEND A-KIT 6F 22G (SHEATH) ×2 IMPLANT
GUIDEWIRE INQWIRE 1.5J.035X260 (WIRE) ×1 IMPLANT
INQWIRE 1.5J .035X260CM (WIRE) ×2
KIT ENCORE 26 ADVANTAGE (KITS) ×4 IMPLANT
KIT HEART LEFT (KITS) ×2 IMPLANT
PACK CARDIAC CATHETERIZATION (CUSTOM PROCEDURE TRAY) ×2 IMPLANT
SLED PULL BACK IVUS (MISCELLANEOUS) ×2 IMPLANT
STENT SIERRA 3.50 X 12 MM (Permanent Stent) ×2 IMPLANT
TRANSDUCER W/STOPCOCK (MISCELLANEOUS) ×2 IMPLANT
TUBING CIL FLEX 10 FLL-RA (TUBING) ×2 IMPLANT
WIRE COUGAR XT STRL 190CM (WIRE) ×2 IMPLANT

## 2018-06-22 NOTE — Progress Notes (Signed)
TRB BAND REMOVAL  LOCATION:    right radial  DEFLATED PER PROTOCOL:    Yes.    TIME BAND OFF / DRESSING APPLIED:    1245   SITE UPON ARRIVAL:    Level 1(old bruise ,soft)  SITE AFTER BAND REMOVAL:    Level 1(bruised, soft) CIRCULATION SENSATION AND MOVEMENT:    Within Normal Limits   Yes.    COMMENTS:   Tolerated procedure well, Post TRB instructions given

## 2018-06-22 NOTE — Brief Op Note (Signed)
BRIEF CARDIAC CATHETERIZATION NOTE  DATE: 06/22/2018 TIME: 8:47 AM  PATIENT:  Percell Boston  57 y.o. male  PRE-OPERATIVE DIAGNOSIS:  CAD and recent   POST-OPERATIVE DIAGNOSIS:  Same  PROCEDURE:  Procedure(s): CORONARY STENT INTERVENTION (N/A) Intravascular Ultrasound/IVUS (N/A) LEFT HEART CATH AND CORONARY ANGIOGRAPHY (N/A)  SURGEON:  Surgeon(s) and Role:    * Brandley Aldrete, Harrell Gave, MD - Primary  FINDINGS: 1. Ulcerated 70% proximal LAD stenosis, heterogenous by IVUS with reference vessel size of 4-4.25 mm. 2. Moderate mid/distal LAD and LCx disease. 3. Patent stent in mid RCA with moderate mid and distal RCA disease. 4. Successful IVUS-guided PCI to proximal LAD using Xience Sierra 3.5 x 12 mm drug-eluting stent (post-dilated to 4.2 mm) with 0% residual stenosis.  TIMI-2 flow noted into distal LAD unchanged from pre-PCI flow and likely due to ectatic vessel.  RECOMMENDATIONS: 1. Continue DAPT with aspirin and ticagrelor for at least 12 months. 2. Aggressive secondary prevention. 3. Likely d/c home tomorrow.  Nelva Bush, MD [facility] Pager: [phone]

## 2018-06-22 NOTE — Interval H&P Note (Signed)
History and Physical Interval Note:  06/22/2018 7:19 AM  Jeremy Raymond  has presented today for cardiac catheterization, with the diagnosis of cad and recent inferior/posterior STEMI. The various methods of treatment have been discussed with the patient and family. After consideration of risks, benefits and other options for treatment, the patient has consented to  Procedure(s): CORONARY STENT INTERVENTION (N/A) as a surgical intervention .  The patient's history has been reviewed, patient examined, no change in status, stable for surgery.  I have reviewed the patient's chart and labs.  Questions were answered to the patient's satisfaction.    Cath Lab Visit (complete for each Cath Lab visit)  Clinical Evaluation Leading to the Procedure:   ACS: Yes.    Non-ACS:  N/A  Keno Caraway

## 2018-06-23 ENCOUNTER — Encounter (HOSPITAL_COMMUNITY): Payer: Self-pay | Admitting: Internal Medicine

## 2018-06-23 ENCOUNTER — Telehealth: Payer: Self-pay | Admitting: Cardiology

## 2018-06-23 LAB — BASIC METABOLIC PANEL
ANION GAP: 7 (ref 5–15)
BUN: 10 mg/dL (ref 6–20)
CHLORIDE: 103 mmol/L (ref 98–111)
CO2: 24 mmol/L (ref 22–32)
Calcium: 8.9 mg/dL (ref 8.9–10.3)
Creatinine, Ser: 0.92 mg/dL (ref 0.61–1.24)
GFR calc non Af Amer: >60 mL/min (ref >60)
GLUCOSE: 91 mg/dL (ref 70–99)
Potassium: 4 mmol/L (ref 3.5–5.1)
Sodium: 134 mmol/L — ABNORMAL LOW (ref 135–145)

## 2018-06-23 LAB — CBC
HEMATOCRIT: 35.2 % — AB (ref 39.0–52.0)
HEMOGLOBIN: 11.4 g/dL — AB (ref 13.0–17.0)
MCH: 30.2 pg (ref 26.0–34.0)
MCHC: 32.4 g/dL (ref 30.0–36.0)
MCV: 93.1 fL (ref 78.0–100.0)
Platelets: 263 10*3/uL (ref 150–400)
RBC: 3.78 MIL/uL — AB (ref 4.22–5.81)
RDW: 13 % (ref 11.5–15.5)
WBC: 5.1 10*3/uL (ref 4.0–10.5)

## 2018-06-23 MED ORDER — TICAGRELOR 90 MG PO TABS: 90.0000 mg | ORAL_TABLET | Freq: Two times a day (BID) | ORAL | 0 refills | Status: DC

## 2018-06-23 MED ORDER — ATORVASTATIN CALCIUM 80 MG PO TABS: 80.0000 mg | ORAL_TABLET | Freq: Every day | ORAL | 3 refills | Status: AC

## 2018-06-23 MED ORDER — LISINOPRIL 5 MG PO TABS: 2.5000 mg | ORAL_TABLET | Freq: Every day | ORAL | 6 refills | Status: DC

## 2018-06-23 MED ORDER — ISOSORBIDE MONONITRATE ER 30 MG PO TB24: 30.0000 mg | ORAL_TABLET | Freq: Every day | ORAL | 3 refills | Status: DC

## 2018-06-23 MED ORDER — CARVEDILOL 6.25 MG PO TABS: 6.2500 mg | ORAL_TABLET | Freq: Two times a day (BID) | ORAL | 3 refills | Status: DC

## 2018-06-23 MED ORDER — NITROGLYCERIN 0.4 MG SL SUBL: 0.4000 mg | SUBLINGUAL_TABLET | SUBLINGUAL | 3 refills | Status: AC | PRN

## 2018-06-23 MED ORDER — ASPIRIN 81 MG PO TBEC: 81.0000 mg | DELAYED_RELEASE_TABLET | Freq: Every day | ORAL | 3 refills | Status: DC

## 2018-06-23 MED ORDER — TICAGRELOR 90 MG PO TABS: 90.0000 mg | ORAL_TABLET | Freq: Two times a day (BID) | ORAL | 3 refills | Status: DC

## 2018-06-23 NOTE — Progress Notes (Signed)
CARDIAC REHAB PHASE I   PRE:  Rate/Rhythm: 76 SR  BP:  Supine: 107/79  Sitting:   Standing:    SaO2: 94%RA  MODE:  Ambulation: 450 ft   POST:  Rate/Rhythm: 99-102 ST  BP:  Supine:   Sitting: 110/82  Standing:    SaO2: 98%RA 0840-0913 Pt walked 450 ft on RA with no CP. Tolerated well. Back to bed after walk. Finished ed with pt. Discussed some heart healthy food choices. Stressed again the importance of brilinta with stent. Has brilinta card. Reviewed ex ed and CRP 2. Referred to [place]   Graylon Good, RN BSN  06/23/2018 9:07 AM

## 2018-06-23 NOTE — Telephone Encounter (Signed)
Pt lives in [place]. Is a new heartcare patient. Heart cath x 2 this admission, discharging today. Needs a TCM in 7-10 days for hospital follow up with any doc out there.  Discharged from [facility] 6-25/19

## 2018-06-23 NOTE — Discharge Summary (Signed)
Discharge Summary    Patient ID: Jeremy Raymond,  MRN: [id number], DOB/AGE: [id number] y.o.  Admit date: 06/18/2018 Discharge date: 06/23/2018  Primary Care Provider: [facility] Primary Cardiologist: Sherren Mocha, MD  Discharge Diagnoses    Active Problems:   Acute inferoposterior myocardial infarction [facility])   STEMI involving right coronary artery [facility])   CAD in native artery   Allergies Allergies  Allergen Reactions  . Black Pepper [[place]] Anaphylaxis  . Capsaicin Anaphylaxis  . Other Anaphylaxis    Hot peppers    Diagnostic Studies/Procedures    Coronary intervention 06/22/18: Conclusions: 1. Diffuse coronary ectasia with 70%, ulcerated appearing proximal LAD stenosis, not significantly changed from prior catheterization last week. 2. Patent mid RCA stent. Thrombotic occlusion of the rPDA has resolved. 3. Upper normal left ventricular filling pressure. 4. Successful IVUS-guided PCI to proximal LAD using Xience Sierra 3.5 x 12 mm drug-eluting stent (post-dilated to 4.2 mm) with 0% residual stenosis.  Recommendations: 1. Dual antiplatelet therapy with aspirin and ticagrelor for at least 12 months. 2. Aggressive secondary prevention. 3. Likely discharge home tomorrow.   Heart cath 06/18/18:  Prox RCA to Mid RCA lesion is 75% stenosed.  RPDA lesion is 100% stenosed.  Mid RCA lesion is 50% stenosed.  A drug-eluting stent was successfully placed using a STENT SIERRA 3.50 X 18 MM.  Post intervention, there is a 0% residual stenosis.  Ost LAD to Prox LAD lesion is 70% stenosed.  Mid LAD lesion is 40% stenosed.  Prox Cx to Mid Cx lesion is 50% stenosed.  The left ventricular ejection fraction is 45-50% by visual estimate.  1. Severe hypodense stenosis in the mid RCA suggestive of the patient's culprit lesion with total occlusion of the distal PDA likely related to atheroembolism from a more proximal plaque 2. Diffuse  coronary ectasia with moderately severe proximal LAD stenosis, moderate mid circumflex stenosis, and otherwise diffuse nonobstructive disease 3. Mild segmental LV systolic dysfunction with severe hypokinesis of the basal and mid inferior wall, and mildly reduced LV systolic function with an ejection fraction estimated at 45 to 50% 4. Successful PCI of the mid RCA with primary stenting using a 3.5 x 18 mm Sierra DES postdilated with a 4.0 mm noncompliant balloon  Recommendations: Post MI medical therapy with aggressive risk reduction measures, uninterrupted dual antiplatelet therapy with aspirin and ticagrelor for 12 months, consideration of staged PCI of the proximal LAD. Will review films with colleagues as the proximal LAD is not critically tight, but the lesion appears ulcerated and stenting of a potentially unstable plaque might be considered.    Echo 06/19/18: Study Conclusions - Left ventricle: The cavity size was normal. Wall thickness was normal. Systolic function was normal. The estimated ejection fraction was in the range of 55% to 60%. There is hypokinesis of the basal-midinferoseptal myocardium. Doppler parameters are consistent with abnormal left ventricular relaxation (grade 1 diastolic dysfunction). - Right ventricle: The cavity size was mildly dilated. Wall thickness was normal.   History of Present Illness     Jeremy Raymond has no past cardiac history.  He has been diagnosed with an iliac aneurysm and small AAA currently being worked up for endovascular repair of his iliac aneurysm.  At noon on 06/18/18, he developed severe substernal chest pain.  This has waxed and waned throughout the day but has been persistent until his arrival here.  He describes substernal pressure radiating to the mid back.  There is no radiation to the  neck, jaw, or shoulders.  There is no associated shortness of breath.  He did have nausea and diaphoresis with this.  He has not had  similar symptoms in the past.  He initially presented to [facility] hospital and was diagnosed with an acute STEMI.  He was given heparin and aspirin and transported here directly for emergency cardiac catheterization.  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  Hospital Course     Consultants: none  Inferior posterior STEMI He was taken emergently to the cath lab. Heart cath on 06/18/18 with 75% stenosis in the midRCA treated with DES; 70% proximal LAD stenosis and 100% occlusion of the RPDA, thought to be clot. Staged intervention planned for 06/22/18 with DES to proximal LAD; also noted that total occlusion of RPDA resolved. Discharged on ASA and brilinta x 12 months. Pt tolerated both procedures well. Right radial is tender, but C/D/I without hematoma.   Hypertension/hypotension Pressures have been marginal. On coreg 6.25 mg BID, imdur 30 mg daily, and lisinopril 5 mg daily at home. I will discharge on 2.5 mg lisinopril. Please reassess pressures at follow up clinic visit.   Brief SVT run on telemetry (35 beats) 12-lead with sinus rhythm. Pt was asymptomatic. Beta blocker as above.    HLD with LDL goal of less than 70 06/19/2018: Cholesterol 117; HDL 33; LDL Cholesterol 65; Triglycerides 97; VLDL 19 Discharged on high dose statin.   Obesity Encouraged weight loss and walking program  Current smoker Pt is not interested in quitting at this time. Discussed cardiac health and dangers of smoking.   _____________  Discharge Vitals Blood pressure 104/74, pulse 72, temperature 97.9 F (36.6 C), temperature source Oral, resp. rate 20, height 5\' 9"  (1.753 m), weight 229 lb 15 oz (104.3 kg), SpO2 98 %.  Filed Weights   06/21/18 0803 06/22/18 0513 06/23/18 0559  Weight: 228 lb 9.6 oz (103.7 kg) 229 lb (103.9 kg) 229 lb 15 oz (104.3 kg)    Labs & Radiologic Studies    CBC Recent Labs    06/22/18 0941  06/23/18 0306  WBC 5.5 5.1  HGB 12.4* 11.4*  HCT 40.1 35.2*  MCV 97.6 93.1  PLT 281 347   Basic Metabolic Panel Recent Labs    06/22/18 0941 06/23/18 0306  NA  --  134*  K  --  4.0  CL  --  103  CO2  --  24  GLUCOSE  --  91  BUN  --  10  CREATININE 0.95 0.92  CALCIUM  --  8.9   Liver Function Tests No results for input(s): AST, ALT, ALKPHOS, BILITOT, PROT, ALBUMIN in the last 72 hours. No results for input(s): LIPASE, AMYLASE in the last 72 hours. Cardiac Enzymes No results for input(s): CKTOTAL, CKMB, CKMBINDEX, TROPONINI in the last 72 hours. BNP Invalid input(s): POCBNP D-Dimer No results for input(s): DDIMER in the last 72 hours. Hemoglobin A1C No results for input(s): HGBA1C in the last 72 hours. Fasting Lipid Panel No results for input(s): CHOL, HDL, LDLCALC, TRIG, CHOLHDL, LDLDIRECT in the last 72 hours. Thyroid Function Tests No results for input(s): TSH, T4TOTAL, T3FREE, THYROIDAB in the last 72 hours.  Invalid input(s): FREET3 _____________  No results found. Disposition   Pt is being discharged home today in good condition.  Follow-up Plans & Appointments     Discharge Instructions    Amb Referral to Cardiac Rehabilitation   Complete by:  As directed    Diagnosis:   STEMI Coronary Stents     Diet - low sodium heart healthy   Complete by:  As directed    Discharge instructions   Complete by:  As directed    No driving for 1 week. No lifting over 5 lbs for 1 week. No sexual activity for 1 week. You may return to work in 1 week. Keep procedure site clean & dry. If you notice increased pain, swelling, bleeding or pus, call/return!  You may shower, but no soaking baths/hot tubs/pools for 1 week.   Increase activity slowly   Complete by:  As directed       Discharge Medications   Allergies as of 06/23/2018      Reactions   Black Pepper [piper] Anaphylaxis   Capsaicin Anaphylaxis   Other Anaphylaxis   Hot peppers      Medication List      STOP taking these medications   amoxicillin-clavulanate 875-125 MG tablet Commonly known as:  AUGMENTIN     TAKE these medications   aspirin 81 MG EC tablet Take 1 tablet (81 mg total) by mouth daily. Start taking on:  06/24/2018   atorvastatin 80 MG tablet Commonly known as:  LIPITOR Take 1 tablet (80 mg total) by mouth daily at 6 PM.   carvedilol 6.25 MG tablet Commonly known as:  COREG Take 1 tablet (6.25 mg total) by mouth 2 (two) times daily with a meal.   [employer] PAIN RELIEF 500 MG tablet Generic drug:  acetaminophen Take 1,000 mg by mouth every 8 (eight) hours.   docusate sodium 100 MG capsule Commonly known as:  COLACE Take 100 mg by mouth 2 (two) times daily.   isosorbide mononitrate 30 MG 24 hr tablet Commonly known as:  IMDUR Take 1 tablet (30 mg total) by mouth daily. Start taking on:  06/24/2018   lisinopril 5 MG tablet Commonly known as:  PRINIVIL,ZESTRIL Take 0.5 tablets (2.5 mg total) by mouth daily. What changed:    medication strength  how much to take   nitroGLYCERIN 0.4 MG SL tablet Commonly known as:  NITROSTAT Place 1 tablet (0.4 mg total) under the tongue every 5 (five) minutes x 3 doses as needed for chest pain.   oxyCODONE 5 MG immediate release tablet Commonly known as:  Oxy IR/ROXICODONE Take 5 mg by mouth every 4 (four) hours as needed for severe pain.   SENNA-TIME 8.6 MG tablet Generic drug:  senna Take 8.6 mg by mouth daily.   ticagrelor 90 MG Tabs tablet Commonly known as:  BRILINTA Take 1 tablet (90 mg total) by mouth 2 (two) times daily.   traMADol 50 MG tablet Commonly known as:  ULTRAM Take 50 mg by mouth every 6 (six) hours as needed for moderate pain.        Acute coronary syndrome (MI, NSTEMI, STEMI, etc) this admission?: Yes.     AHA/ACC Clinical Performance & Quality Measures: 4. Aspirin prescribed? - Yes 5. ADP Receptor Inhibitor (Plavix/Clopidogrel, Brilinta/Ticagrelor or Effient/Prasugrel) prescribed (includes  medically managed patients)? - Yes 6. Beta Blocker prescribed? - Yes 7. High Intensity Statin (Lipitor 40-80mg  or Crestor 20-40mg ) prescribed? - Yes 8. EF assessed during THIS hospitalization? - Yes 9. For EF <40%, was ACEI/ARB prescribed? - Yes 10. For EF <40%, Aldosterone Antagonist (Spironolactone or Eplerenone) prescribed? - Not Applicable (EF >/= 63%) 11. Cardiac Rehab Phase II ordered (Included Medically managed Patients)? - Yes     Outstanding Labs/Studies   Check  pressure  Duration of Discharge Encounter   Greater than 30 minutes including physician time.  Signed, [place], [place] 06/23/2018, 12:32 PM

## 2018-06-23 NOTE — Progress Notes (Signed)
Progress Note  Patient Name: Jeremy Raymond Date of Encounter: 06/23/2018  Primary Cardiologist: Sherren Mocha, MD   Subjective   Pt denies chest pain, shortness of breath, and bleeding. Pt has no interest in quitting smoking. Lives in [place].   Inpatient Medications    Scheduled Meds: . aspirin  324 mg Oral Once  . aspirin EC  81 mg Oral Daily  . atorvastatin  80 mg Oral q1800  . carvedilol  6.25 mg Oral BID WC  . heparin  5,000 Units Subcutaneous Q8H  . isosorbide mononitrate  30 mg Oral Daily  . lisinopril  5 mg Oral Daily  . sodium chloride flush  3 mL Intravenous Q12H  . sodium chloride flush  3 mL Intravenous Q12H  . ticagrelor  90 mg Oral BID   Continuous Infusions: . sodium chloride Stopped (06/18/18 2215)  . sodium chloride    . sodium chloride     PRN Meds: sodium chloride, sodium chloride, acetaminophen, gi cocktail, morphine injection, nitroGLYCERIN, ondansetron (ZOFRAN) IV, sodium chloride flush, sodium chloride flush, traMADol   Vital Signs    Vitals:   06/22/18 2130 06/22/18 2131 06/23/18 0559 06/23/18 0839  BP: 92/65  (!) 118/93 107/79  Pulse:  60 75 (!) 15  Resp: 17 16 12 15   Temp:   97.8 F (36.6 C) (!) 97.5 F (36.4 C)  TempSrc:   Oral Oral  SpO2:  95% 96% 98%  Weight:   229 lb 15 oz (104.3 kg)   Height:        Intake/Output Summary (Last 24 hours) at 06/23/2018 0938 Last data filed at 06/23/2018 6440 Gross per 24 hour  Intake 720 ml  Output 1800 ml  Net -1080 ml   Filed Weights   06/21/18 0803 06/22/18 0513 06/23/18 0559  Weight: 228 lb 9.6 oz (103.7 kg) 229 lb (103.9 kg) 229 lb 15 oz (104.3 kg)    Telemetry    Sinus  - Personally Reviewed  ECG    No new tracings - Personally Reviewed  Physical Exam   GEN: No acute distress.   Neck: No JVD Cardiac: RRR, no murmurs, rubs, or gallops. Right radial C/D/I  Respiratory: Clear to auscultation bilaterally. GI: Soft, nontender, non-distended  MS: No edema; No  deformity. Neuro:  Nonfocal  Psych: Normal affect   Labs    Chemistry Recent Labs  Lab 06/18/18 2232 06/19/18 0326 06/20/18 0119 06/22/18 0941 06/23/18 0306  NA 134* 134* 137  --  134*  K 4.5 3.9 4.1  --  4.0  CL 99* 102 104  --  103  CO2 25 23 26   --  24  GLUCOSE 116* 116* 100*  --  91  BUN 10 8 8   --  10  CREATININE 0.92 0.78 0.80 0.95 0.92  CALCIUM 9.1 8.9 9.3  --  8.9  PROT 6.4*  --   --   --   --   ALBUMIN 3.0*  --   --   --   --   AST 49*  --   --   --   --   ALT 34  --   --   --   --   ALKPHOS 135*  --   --   --   --   BILITOT 0.2*  --   --   --   --   GFRNONAA >60 >60 >60 >60 >60  GFRAA >60 >60 >60 >60 >60  ANIONGAP 10 9 7   --  7     Hematology Recent Labs  Lab 06/20/18 0119 06/22/18 0941 06/23/18 0306  WBC 5.3 5.5 5.1  RBC 3.93* 4.11* 3.78*  HGB 11.9* 12.4* 11.4*  HCT 36.4* 40.1 35.2*  MCV 92.6 97.6 93.1  MCH 30.3 30.2 30.2  MCHC 32.7 30.9 32.4  RDW 13.1 13.0 13.0  PLT 270 281 263    Cardiac Enzymes Recent Labs  Lab 06/18/18 2232 06/19/18 0326 06/19/18 1036  TROPONINI 3.71* 11.84* 16.51*   No results for input(s): TROPIPOC in the last 168 hours.   BNP Recent Labs  Lab 06/18/18 2232  BNP 74.9     DDimer No results for input(s): DDIMER in the last 168 hours.   Radiology    No results found.  Cardiac Studies   Coronary intervention 06/22/18: Conclusions: 1. Diffuse coronary ectasia with 70%, ulcerated appearing proximal LAD stenosis, not significantly changed from prior catheterization last week. 2. Patent mid RCA stent.  Thrombotic occlusion of the rPDA has resolved. 3. Upper normal left ventricular filling pressure. 4. Successful IVUS-guided PCI to proximal LAD using Xience Sierra 3.5 x 12 mm drug-eluting stent (post-dilated to 4.2 mm) with 0% residual stenosis.  Recommendations: 1. Dual antiplatelet therapy with aspirin and ticagrelor for at least 12 months. 2. Aggressive secondary prevention. 3. Likely discharge home  tomorrow.   Heart cath 06/18/18:  Prox RCA to Mid RCA lesion is 75% stenosed.  RPDA lesion is 100% stenosed.  Mid RCA lesion is 50% stenosed.  A drug-eluting stent was successfully placed using a STENT SIERRA 3.50 X 18 MM.  Post intervention, there is a 0% residual stenosis.  Ost LAD to Prox LAD lesion is 70% stenosed.  Mid LAD lesion is 40% stenosed.  Prox Cx to Mid Cx lesion is 50% stenosed.  The left ventricular ejection fraction is 45-50% by visual estimate.   1.  Severe hypodense stenosis in the mid RCA suggestive of the patient's culprit lesion with total occlusion of the distal PDA likely related to atheroembolism from a more proximal plaque 2.  Diffuse coronary ectasia with moderately severe proximal LAD stenosis, moderate mid circumflex stenosis, and otherwise diffuse nonobstructive disease 3.  Mild segmental LV systolic dysfunction with severe hypokinesis of the basal and mid inferior wall, and mildly reduced LV systolic function with an ejection fraction estimated at 45 to 50% 4.  Successful PCI of the mid RCA with primary stenting using a 3.5 x 18 mm Sierra DES postdilated with a 4.0 mm noncompliant balloon  Recommendations: Post MI medical therapy with aggressive risk reduction measures, uninterrupted dual antiplatelet therapy with aspirin and ticagrelor for 12 months, consideration of staged PCI of the proximal LAD.  Will review films with colleagues as the proximal LAD is not critically tight, but the lesion appears ulcerated and stenting of a potentially unstable plaque might be considered.     Echo 06/19/18: Study Conclusions - Left ventricle: The cavity size was normal. Wall thickness was   normal. Systolic function was normal. The estimated ejection   fraction was in the range of 55% to 60%. There is hypokinesis of   the basal-midinferoseptal myocardium. Doppler parameters are   consistent with abnormal left ventricular relaxation (grade 1   diastolic  dysfunction). - Right ventricle: The cavity size was mildly dilated. Wall   thickness was normal.  Patient Profile     57 y.o. male with acute inferior posterior myocardial infarction, DES to mid RCA, distal PDA occluded from embolization with plans for PCI of LAD ulcerated  lesion Monday 6/24.  Hypotension noted.   Assessment & Plan    1. Inferior posterior STEMI - heart cath 06/18/18 with 75% stenosis in the midRCA treated with DES; 70% proximal LAD stenosis and 100% occlusion of the RPDA, thought to be clot - staged intervention planned for 06/22/18 with DES to proximal LAD; also noted that total occlusion of RPDA resolved - plan to discharge on ASA and brilinta x 12 months   2. HTN/hypotension - pressure have been marginal - coreg 6.25 mg BID, imdur 30 mg daily, lisinopril 5 mg daily - will decrease lisinopril to 2.5 mg daily until he can be reassessed in clinic at follow up   3. Brief paroxysmal atrial tachycardia on telemetry  - beta blocker as above   4. HLD - 06/19/2018: Cholesterol 117; HDL 33; LDL Cholesterol 65; Triglycerides 97; VLDL 19 - discharge on high dose statin   5. Obesity - encouraged weight loss and walking program   6. Current smoker - pt has no interest in quitting, discussed cardiac health    For questions or updates, please contact [place] Please consult [email or web address] for contact info under Cardiology/STEMI.      Signed, Tami Lin Naphtali Riede, PA  06/23/2018, 9:38 AM

## 2018-06-25 ENCOUNTER — Encounter: Payer: Self-pay | Admitting: Cardiology
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
## 2018-06-25 NOTE — Telephone Encounter (Signed)
Patient contacted regarding discharge from [facility] on 06/23/2018.    Patient understands to follow up with Dr. Domenic Polite on Friday, 06/26/2018 at 11:40 in [place] office.   Patient understands discharge instructions?  YES  Patient understands medications and regiment?  YES  Patient understands to bring all medications to this visit?  YES

## 2018-06-26 ENCOUNTER — Encounter: Payer: Self-pay | Admitting: Cardiology

## 2018-06-26 ENCOUNTER — Ambulatory Visit: Payer: BLUE CROSS/BLUE SHIELD | Admitting: Cardiology

## 2018-06-26 VITALS — BP 86/52 | HR 90 | Ht 69.0 in | Wt 231.0 lb

## 2018-06-26 DIAGNOSIS — I723 Aneurysm of iliac artery: Secondary | ICD-10-CM | POA: Diagnosis not present

## 2018-06-26 DIAGNOSIS — C609 Malignant neoplasm of penis, unspecified: Secondary | ICD-10-CM | POA: Diagnosis not present

## 2018-06-26 DIAGNOSIS — I25119 Atherosclerotic heart disease of native coronary artery with unspecified angina pectoris: Secondary | ICD-10-CM | POA: Diagnosis not present

## 2018-06-26 DIAGNOSIS — I2119 ST elevation (STEMI) myocardial infarction involving other coronary artery of inferior wall: Secondary | ICD-10-CM

## 2018-06-26 DIAGNOSIS — Z79899 Other long term (current) drug therapy: Secondary | ICD-10-CM | POA: Diagnosis not present

## 2018-06-26 DIAGNOSIS — I714 Abdominal aortic aneurysm, without rupture, unspecified: Secondary | ICD-10-CM

## 2018-06-26 MED ORDER — LISINOPRIL 5 MG PO TABS
2.5000 mg | ORAL_TABLET | Freq: Every day | ORAL | 6 refills | Status: DC
Start: 2018-06-26 — End: 2018-07-11

## 2018-06-26 NOTE — Patient Instructions (Addendum)
Medication Instructions:   Your physician has recommended you make the following change in your medication:   Hold lisinopril.  Continue all other medications the same.  Labwork:  NONE  Testing/Procedures:   NONE  Follow-Up:  Your physician recommends that you schedule a follow-up appointment in: 4 weeks.  Any Other Special Instructions Will Be Listed Below (If Applicable).  If you need a refill on your cardiac medications before your next appointment, please call your pharmacy.

## 2018-07-07 ENCOUNTER — Inpatient Hospital Stay (HOSPITAL_COMMUNITY)
Admission: EM | Admit: 2018-07-07 | Discharge: 2018-07-11 | DRG: 251 | Disposition: A | Discharge status: Home / Self Care | Payer: BLUE CROSS/BLUE SHIELD | Attending: Cardiovascular Disease | Admitting: Cardiovascular Disease

## 2018-07-07 ENCOUNTER — Other Ambulatory Visit: Payer: Self-pay

## 2018-07-07 ENCOUNTER — Encounter (HOSPITAL_COMMUNITY)
Admission: EM | Disposition: A | Discharge status: Home / Self Care | Payer: Self-pay | Source: Home / Self Care | Attending: Cardiovascular Disease

## 2018-07-07 ENCOUNTER — Inpatient Hospital Stay (HOSPITAL_COMMUNITY)
Admission: EM | Disposition: A | Discharge status: Home / Self Care | Payer: Self-pay | Source: Home / Self Care | Attending: Cardiovascular Disease

## 2018-07-07 DIAGNOSIS — I5030 Unspecified diastolic (congestive) heart failure: Secondary | ICD-10-CM | POA: Diagnosis not present

## 2018-07-07 DIAGNOSIS — I2121 ST elevation (STEMI) myocardial infarction involving left circumflex coronary artery: Secondary | ICD-10-CM

## 2018-07-07 DIAGNOSIS — E782 Mixed hyperlipidemia: Secondary | ICD-10-CM | POA: Diagnosis not present

## 2018-07-07 DIAGNOSIS — Z8549 Personal history of malignant neoplasm of other male genital organs: Secondary | ICD-10-CM | POA: Diagnosis not present

## 2018-07-07 DIAGNOSIS — Z955 Presence of coronary angioplasty implant and graft: Secondary | ICD-10-CM | POA: Diagnosis not present

## 2018-07-07 DIAGNOSIS — I2510 Atherosclerotic heart disease of native coronary artery without angina pectoris: Secondary | ICD-10-CM | POA: Diagnosis present

## 2018-07-07 DIAGNOSIS — I2119 ST elevation (STEMI) myocardial infarction involving other coronary artery of inferior wall: Secondary | ICD-10-CM

## 2018-07-07 DIAGNOSIS — I110 Hypertensive heart disease with heart failure: Secondary | ICD-10-CM | POA: Diagnosis present

## 2018-07-07 DIAGNOSIS — R079 Chest pain, unspecified: Secondary | ICD-10-CM | POA: Diagnosis present

## 2018-07-07 DIAGNOSIS — I4891 Unspecified atrial fibrillation: Secondary | ICD-10-CM

## 2018-07-07 DIAGNOSIS — Z888 Allergy status to other drugs, medicaments and biological substances status: Secondary | ICD-10-CM

## 2018-07-07 DIAGNOSIS — C609 Malignant neoplasm of penis, unspecified: Secondary | ICD-10-CM

## 2018-07-07 DIAGNOSIS — I739 Peripheral vascular disease, unspecified: Secondary | ICD-10-CM | POA: Diagnosis present

## 2018-07-07 DIAGNOSIS — I221 Subsequent ST elevation (STEMI) myocardial infarction of inferior wall: Principal | ICD-10-CM | POA: Diagnosis present

## 2018-07-07 DIAGNOSIS — Z87891 Personal history of nicotine dependence: Secondary | ICD-10-CM

## 2018-07-07 DIAGNOSIS — I481 Persistent atrial fibrillation: Secondary | ICD-10-CM | POA: Diagnosis not present

## 2018-07-07 DIAGNOSIS — Z79899 Other long term (current) drug therapy: Secondary | ICD-10-CM | POA: Diagnosis not present

## 2018-07-07 DIAGNOSIS — I714 Abdominal aortic aneurysm, without rupture, unspecified: Secondary | ICD-10-CM | POA: Diagnosis present

## 2018-07-07 DIAGNOSIS — Z91018 Allergy to other foods: Secondary | ICD-10-CM | POA: Diagnosis not present

## 2018-07-07 DIAGNOSIS — I480 Paroxysmal atrial fibrillation: Secondary | ICD-10-CM | POA: Diagnosis not present

## 2018-07-07 DIAGNOSIS — I2111 ST elevation (STEMI) myocardial infarction involving right coronary artery: Secondary | ICD-10-CM

## 2018-07-07 DIAGNOSIS — I25119 Atherosclerotic heart disease of native coronary artery with unspecified angina pectoris: Secondary | ICD-10-CM | POA: Diagnosis not present

## 2018-07-07 DIAGNOSIS — Z8545 Personal history of malignant neoplasm of unspecified male genital organ: Secondary | ICD-10-CM | POA: Diagnosis not present

## 2018-07-07 DIAGNOSIS — I4892 Unspecified atrial flutter: Secondary | ICD-10-CM | POA: Diagnosis present

## 2018-07-07 DIAGNOSIS — Z7982 Long term (current) use of aspirin: Secondary | ICD-10-CM | POA: Diagnosis not present

## 2018-07-07 DIAGNOSIS — I5022 Chronic systolic (congestive) heart failure: Secondary | ICD-10-CM

## 2018-07-07 HISTORY — DX: Paroxysmal atrial fibrillation: I48.0

## 2018-07-07 HISTORY — PX: LEFT HEART CATH AND CORONARY ANGIOGRAPHY: CATH118249

## 2018-07-07 HISTORY — PX: CORONARY/GRAFT ACUTE MI REVASCULARIZATION: CATH118305

## 2018-07-07 HISTORY — DX: Chronic systolic (congestive) heart failure: I50.22

## 2018-07-07 LAB — LIPID PANEL
CHOL/HDL RATIO: 3 ratio
CHOLESTEROL: 74 mg/dL (ref 0–200)
HDL: 25 mg/dL — ABNORMAL LOW (ref >40)
LDL CALC: 38 mg/dL (ref 0–99)
Triglycerides: 57 mg/dL (ref <150)
VLDL: 11 mg/dL (ref 0–40)

## 2018-07-07 LAB — POCT I-STAT, CHEM 8
BUN: 12 mg/dL (ref 6–20)
CALCIUM ION: 1.21 mmol/L (ref 1.15–1.40)
Chloride: 100 mmol/L (ref 98–111)
Creatinine, Ser: 1.1 mg/dL (ref 0.61–1.24)
Glucose, Bld: 144 mg/dL — ABNORMAL HIGH (ref 70–99)
HEMATOCRIT: 33 % — AB (ref 39.0–52.0)
HEMOGLOBIN: 11.2 g/dL — AB (ref 13.0–17.0)
Potassium: 3.6 mmol/L (ref 3.5–5.1)
SODIUM: 135 mmol/L (ref 135–145)
TCO2: 20 mmol/L — AB (ref 22–32)

## 2018-07-07 LAB — CBC
HCT: 34.9 % — ABNORMAL LOW (ref 39.0–52.0)
HEMOGLOBIN: 11.2 g/dL — AB (ref 13.0–17.0)
MCH: 29.6 pg (ref 26.0–34.0)
MCHC: 32.1 g/dL (ref 30.0–36.0)
MCV: 92.3 fL (ref 78.0–100.0)
Platelets: 269 10*3/uL (ref 150–400)
RBC: 3.78 MIL/uL — AB (ref 4.22–5.81)
RDW: 13.1 % (ref 11.5–15.5)
WBC: 7 10*3/uL (ref 4.0–10.5)

## 2018-07-07 LAB — COMPREHENSIVE METABOLIC PANEL
ALBUMIN: 2.6 g/dL — AB (ref 3.5–5.0)
ALT: 31 U/L (ref 0–44)
AST: 21 U/L (ref 15–41)
Alkaline Phosphatase: 137 U/L — ABNORMAL HIGH (ref 38–126)
Anion gap: 10 (ref 5–15)
BUN: 12 mg/dL (ref 6–20)
CHLORIDE: 102 mmol/L (ref 98–111)
CO2: 20 mmol/L — AB (ref 22–32)
CREATININE: 1.19 mg/dL (ref 0.61–1.24)
Calcium: 8.3 mg/dL — ABNORMAL LOW (ref 8.9–10.3)
GFR calc Af Amer: >60 mL/min (ref >60)
GFR calc non Af Amer: >60 mL/min (ref >60)
GLUCOSE: 145 mg/dL — AB (ref 70–99)
POTASSIUM: 3.5 mmol/L (ref 3.5–5.1)
Sodium: 132 mmol/L — ABNORMAL LOW (ref 135–145)
Total Bilirubin: 0.6 mg/dL (ref 0.3–1.2)
Total Protein: 5.7 g/dL — ABNORMAL LOW (ref 6.5–8.1)

## 2018-07-07 LAB — TROPONIN I
TROPONIN I: 0.03 ng/mL — AB (ref <0.03)
TROPONIN I: 0.03 ng/mL — AB (ref <0.03)

## 2018-07-07 LAB — POCT ACTIVATED CLOTTING TIME: ACTIVATED CLOTTING TIME: 499 s

## 2018-07-07 LAB — PROTIME-INR
INR: 1.29
PROTHROMBIN TIME: 16 s — AB (ref 11.4–15.2)

## 2018-07-07 LAB — APTT: aPTT: 165 seconds (ref 24–36)

## 2018-07-07 SURGERY — LEFT HEART CATH AND CORONARY ANGIOGRAPHY
Anesthesia: LOCAL

## 2018-07-07 MED ORDER — MORPHINE SULFATE (PF) 2 MG/ML IV SOLN
2.0000 mg | INTRAVENOUS | Status: DC | PRN
  Administered 2018-07-07 – 2018-07-08 (×7): 2 mg via INTRAVENOUS
  Filled 2018-07-07 (×7): qty 1

## 2018-07-07 MED ORDER — HYDRALAZINE HCL 20 MG/ML IJ SOLN
5.0000 mg | INTRAMUSCULAR | Status: AC | PRN
  Administered 2018-07-07: 5 mg via INTRAVENOUS
  Filled 2018-07-07: qty 1

## 2018-07-07 MED ORDER — IOPAMIDOL (ISOVUE-370) INJECTION 76%
INTRAVENOUS | Status: DC | PRN
  Administered 2018-07-07: 85 mL via INTRA_ARTERIAL

## 2018-07-07 MED ORDER — SODIUM CHLORIDE 0.9 % IV SOLN
INTRAVENOUS | Status: DC | PRN
  Administered 2018-07-07: 1.75 mg/kg/h via INTRAVENOUS

## 2018-07-07 MED ORDER — SODIUM CHLORIDE 0.9% FLUSH
3.0000 mL | INTRAVENOUS | Status: DC | PRN
  Administered 2018-07-10: 3 mL via INTRAVENOUS
  Filled 2018-07-07: qty 3

## 2018-07-07 MED ORDER — AMIODARONE HCL IN DEXTROSE 360-4.14 MG/200ML-% IV SOLN
INTRAVENOUS | Status: AC
  Administered 2018-07-07: 60 mg/h
  Filled 2018-07-07: qty 200

## 2018-07-07 MED ORDER — ONDANSETRON HCL 4 MG/2ML IJ SOLN
4.0000 mg | Freq: Four times a day (QID) | INTRAMUSCULAR | Status: DC | PRN
Start: 2018-07-07 — End: 2018-07-11
  Administered 2018-07-10: 4 mg via INTRAVENOUS
  Filled 2018-07-07: qty 2

## 2018-07-07 MED ORDER — TIROFIBAN (AGGRASTAT) BOLUS VIA INFUSION
INTRAVENOUS | Status: DC | PRN
  Administered 2018-07-07: 2620 ug via INTRAVENOUS

## 2018-07-07 MED ORDER — LIDOCAINE HCL (PF) 1 % IJ SOLN
INTRAMUSCULAR | Status: AC
  Filled 2018-07-07: qty 30

## 2018-07-07 MED ORDER — HEPARIN (PORCINE) IN NACL 1000-0.9 UT/500ML-% IV SOLN
INTRAVENOUS | Status: AC
  Filled 2018-07-07: qty 1000

## 2018-07-07 MED ORDER — NITROGLYCERIN IN D5W 200-5 MCG/ML-% IV SOLN
INTRAVENOUS | Status: AC
  Filled 2018-07-07: qty 250

## 2018-07-07 MED ORDER — ASPIRIN 81 MG PO CHEW
81.0000 mg | CHEWABLE_TABLET | Freq: Every day | ORAL | Status: DC
  Administered 2018-07-07 – 2018-07-11 (×5): 81 mg via ORAL
  Filled 2018-07-07 (×5): qty 1

## 2018-07-07 MED ORDER — BIVALIRUDIN TRIFLUOROACETATE 250 MG IV SOLR
INTRAVENOUS | Status: AC
  Filled 2018-07-07: qty 250

## 2018-07-07 MED ORDER — TIROFIBAN HCL IV 12.5 MG/250 ML
INTRAVENOUS | Status: AC | PRN
  Administered 2018-07-07: 0.15 ug/kg/min via INTRAVENOUS

## 2018-07-07 MED ORDER — SODIUM CHLORIDE 0.9 % IV SOLN
INTRAVENOUS | Status: AC
  Administered 2018-07-07 – 2018-07-08 (×2): via INTRAVENOUS

## 2018-07-07 MED ORDER — ATORVASTATIN CALCIUM 80 MG PO TABS
80.0000 mg | ORAL_TABLET | Freq: Every day | ORAL | Status: DC
  Administered 2018-07-08 – 2018-07-10 (×3): 80 mg via ORAL
  Filled 2018-07-07 (×3): qty 1

## 2018-07-07 MED ORDER — VERAPAMIL HCL 2.5 MG/ML IV SOLN
INTRAVENOUS | Status: DC | PRN
  Administered 2018-07-07: 18:00:00 via INTRA_ARTERIAL

## 2018-07-07 MED ORDER — VERAPAMIL HCL 2.5 MG/ML IV SOLN
INTRAVENOUS | Status: AC
  Filled 2018-07-07: qty 2

## 2018-07-07 MED ORDER — TICAGRELOR 90 MG PO TABS
ORAL_TABLET | ORAL | Status: DC | PRN
  Administered 2018-07-07: 90 mg via ORAL

## 2018-07-07 MED ORDER — AMIODARONE HCL IN DEXTROSE 360-4.14 MG/200ML-% IV SOLN
30.0000 mg/h | INTRAVENOUS | Status: DC
  Administered 2018-07-08: 30 mg/h via INTRAVENOUS

## 2018-07-07 MED ORDER — SODIUM CHLORIDE 0.9 % IV SOLN: 250.0000 mL | INTRAVENOUS | Status: DC | PRN

## 2018-07-07 MED ORDER — BIVALIRUDIN BOLUS VIA INFUSION - CUPID
INTRAVENOUS | Status: DC | PRN
  Administered 2018-07-07: 78.6 mg via INTRAVENOUS

## 2018-07-07 MED ORDER — NITROGLYCERIN IN D5W 200-5 MCG/ML-% IV SOLN
INTRAVENOUS | Status: AC | PRN
  Administered 2018-07-07: 10 ug/min via INTRAVENOUS

## 2018-07-07 MED ORDER — NITROGLYCERIN IN D5W 200-5 MCG/ML-% IV SOLN: 2.0000 ug/min | INTRAVENOUS | Status: DC

## 2018-07-07 MED ORDER — ACETAMINOPHEN 325 MG PO TABS
650.0000 mg | ORAL_TABLET | ORAL | Status: DC | PRN
  Administered 2018-07-07: 650 mg via ORAL
  Filled 2018-07-07: qty 2

## 2018-07-07 MED ORDER — LIDOCAINE HCL (PF) 1 % IJ SOLN
INTRAMUSCULAR | Status: DC | PRN
  Administered 2018-07-07: 2 mL

## 2018-07-07 MED ORDER — IOPAMIDOL (ISOVUE-370) INJECTION 76%
INTRAVENOUS | Status: AC
  Filled 2018-07-07: qty 125

## 2018-07-07 MED ORDER — TICAGRELOR 90 MG PO TABS
90.0000 mg | ORAL_TABLET | Freq: Two times a day (BID) | ORAL | Status: DC
  Administered 2018-07-07 – 2018-07-08 (×2): 90 mg via ORAL
  Filled 2018-07-07 (×2): qty 1

## 2018-07-07 MED ORDER — NITROGLYCERIN 1 MG/10 ML FOR IR/CATH LAB
INTRA_ARTERIAL | Status: AC
  Filled 2018-07-07: qty 10

## 2018-07-07 MED ORDER — SODIUM CHLORIDE 0.9 % IV SOLN
INTRAVENOUS | Status: AC | PRN
  Administered 2018-07-07: 250 mL via INTRAVENOUS

## 2018-07-07 MED ORDER — IOHEXOL 350 MG/ML SOLN
INTRAVENOUS | Status: DC | PRN
  Administered 2018-07-07: 5 mL via INTRACARDIAC

## 2018-07-07 MED ORDER — AMIODARONE LOAD VIA INFUSION
INTRAVENOUS | Status: DC | PRN
  Administered 2018-07-07: 150 mg via INTRAVENOUS

## 2018-07-07 MED ORDER — SODIUM CHLORIDE 0.9% FLUSH
3.0000 mL | Freq: Two times a day (BID) | INTRAVENOUS | Status: DC
  Administered 2018-07-07 – 2018-07-08 (×3): 3 mL via INTRAVENOUS
  Administered 2018-07-09: 6 mL via INTRAVENOUS
  Administered 2018-07-10 – 2018-07-11 (×2): 3 mL via INTRAVENOUS

## 2018-07-07 MED ORDER — AMIODARONE HCL IN DEXTROSE 360-4.14 MG/200ML-% IV SOLN
INTRAVENOUS | Status: AC
  Filled 2018-07-07: qty 200

## 2018-07-07 MED ORDER — TICAGRELOR 90 MG PO TABS: 90.0000 mg | ORAL_TABLET | Freq: Two times a day (BID) | ORAL | Status: DC

## 2018-07-07 MED ORDER — AMIODARONE HCL IN DEXTROSE 360-4.14 MG/200ML-% IV SOLN
60.0000 mg/h | INTRAVENOUS | Status: AC
  Administered 2018-07-07: 60 mg/h via INTRAVENOUS
  Filled 2018-07-07: qty 200

## 2018-07-07 MED ORDER — ASPIRIN 81 MG PO TBEC: 81.0000 mg | DELAYED_RELEASE_TABLET | Freq: Every day | ORAL | Status: DC

## 2018-07-07 MED ORDER — TIROFIBAN HCL IV 12.5 MG/250 ML
0.1500 ug/kg/min | INTRAVENOUS | Status: AC
  Administered 2018-07-08: 0.15 ug/kg/min via INTRAVENOUS
  Filled 2018-07-07 (×3): qty 100
  Filled 2018-07-07: qty 250

## 2018-07-07 MED ORDER — TICAGRELOR 90 MG PO TABS
ORAL_TABLET | ORAL | Status: AC
  Filled 2018-07-07: qty 1

## 2018-07-07 MED ORDER — HEPARIN (PORCINE) IN NACL 2-0.9 UNITS/ML: INTRAMUSCULAR | Status: DC | PRN

## 2018-07-07 MED ORDER — LABETALOL HCL 5 MG/ML IV SOLN: 10.0000 mg | INTRAVENOUS | Status: AC | PRN

## 2018-07-07 MED ORDER — TIROFIBAN HCL IV 12.5 MG/250 ML
INTRAVENOUS | Status: AC
  Filled 2018-07-07: qty 250

## 2018-07-07 MED ORDER — AMIODARONE HCL IN DEXTROSE 360-4.14 MG/200ML-% IV SOLN
INTRAVENOUS | Status: AC | PRN
  Administered 2018-07-07: 60 mg/h
  Administered 2018-07-07: 60 mg/h via INTRAVENOUS

## 2018-07-07 SURGICAL SUPPLY — 18 items
BALLN SAPPHIRE 2.0X12 (BALLOONS) ×2
BALLOON SAPPHIRE 2.0X12 (BALLOONS) ×1 IMPLANT
CATH INFINITI 5FR ANG PIGTAIL (CATHETERS) ×2 IMPLANT
CATH OPTITORQUE TIG 4.0 5F (CATHETERS) ×2 IMPLANT
CATH VISTA GUIDE 6FR XB4 (CATHETERS) ×2 IMPLANT
COVER PRB 48X5XTLSCP FOLD TPE (BAG) ×1 IMPLANT
COVER PROBE 5X48 (BAG) ×1
DEVICE RAD COMP TR BAND LRG (VASCULAR PRODUCTS) ×2 IMPLANT
GLIDESHEATH SLEND A-KIT 6F 22G (SHEATH) ×2 IMPLANT
GUIDEWIRE INQWIRE 1.5J.035X260 (WIRE) ×1 IMPLANT
INQWIRE 1.5J .035X260CM (WIRE) ×2
KIT ENCORE 26 ADVANTAGE (KITS) ×2 IMPLANT
KIT HEART LEFT (KITS) ×2 IMPLANT
PACK CARDIAC CATHETERIZATION (CUSTOM PROCEDURE TRAY) ×2 IMPLANT
TRANSDUCER W/STOPCOCK (MISCELLANEOUS) ×2 IMPLANT
TUBING CIL FLEX 10 FLL-RA (TUBING) ×2 IMPLANT
WIRE ASAHI PROWATER 180CM (WIRE) ×2 IMPLANT
WIRE HI TORQ VERSACORE-J 145CM (WIRE) ×2 IMPLANT

## 2018-07-07 NOTE — Progress Notes (Signed)
CRITICAL VALUE ALERT  Critical Value:  troponin 0.03  Date & Time Notied:  07/07/2018  1928  Provider Notified: Dr Gwenlyn Found  Orders Received/Actions taken: MD expected value post cath
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
## 2018-07-08 ENCOUNTER — Inpatient Hospital Stay (HOSPITAL_COMMUNITY): Payer: BLUE CROSS/BLUE SHIELD

## 2018-07-08 ENCOUNTER — Encounter (HOSPITAL_COMMUNITY): Payer: Self-pay

## 2018-07-08 DIAGNOSIS — I5030 Unspecified diastolic (congestive) heart failure: Secondary | ICD-10-CM

## 2018-07-08 LAB — CBC
HEMATOCRIT: 34.4 % — AB (ref 39.0–52.0)
HEMOGLOBIN: 10.9 g/dL — AB (ref 13.0–17.0)
MCH: 29.7 pg (ref 26.0–34.0)
MCHC: 31.7 g/dL (ref 30.0–36.0)
MCV: 93.7 fL (ref 78.0–100.0)
Platelets: 261 10*3/uL (ref 150–400)
RBC: 3.67 MIL/uL — ABNORMAL LOW (ref 4.22–5.81)
RDW: 13.3 % (ref 11.5–15.5)
WBC: 6.6 10*3/uL (ref 4.0–10.5)

## 2018-07-08 LAB — BASIC METABOLIC PANEL
ANION GAP: 10 (ref 5–15)
BUN: 9 mg/dL (ref 6–20)
CHLORIDE: 102 mmol/L (ref 98–111)
CO2: 22 mmol/L (ref 22–32)
Calcium: 8.6 mg/dL — ABNORMAL LOW (ref 8.9–10.3)
Creatinine, Ser: 1.03 mg/dL (ref 0.61–1.24)
GFR calc Af Amer: >60 mL/min (ref >60)
GLUCOSE: 98 mg/dL (ref 70–99)
POTASSIUM: 4 mmol/L (ref 3.5–5.1)
Sodium: 134 mmol/L — ABNORMAL LOW (ref 135–145)

## 2018-07-08 LAB — TROPONIN I
TROPONIN I: 4.19 ng/mL — AB (ref <0.03)
TROPONIN I: 13.58 ng/mL — AB (ref <0.03)

## 2018-07-08 LAB — ECHOCARDIOGRAM COMPLETE
Height: 69 in
Weight: 3760.17 oz

## 2018-07-08 LAB — HEMOGLOBIN A1C
HEMOGLOBIN A1C: 5.5 % (ref 4.8–5.6)
Mean Plasma Glucose: 111 mg/dL

## 2018-07-08 MED ORDER — CLOPIDOGREL BISULFATE 75 MG PO TABS
75.0000 mg | ORAL_TABLET | Freq: Every day | ORAL | Status: DC
  Administered 2018-07-09 – 2018-07-11 (×3): 75 mg via ORAL
  Filled 2018-07-08 (×3): qty 1

## 2018-07-08 MED ORDER — TRAMADOL HCL 50 MG PO TABS
100.0000 mg | ORAL_TABLET | Freq: Once | ORAL | Status: AC
  Administered 2018-07-08: 100 mg via ORAL
  Filled 2018-07-08: qty 2

## 2018-07-08 MED ORDER — METOPROLOL TARTRATE 12.5 MG HALF TABLET
12.5000 mg | ORAL_TABLET | Freq: Two times a day (BID) | ORAL | Status: DC
  Administered 2018-07-08 (×2): 12.5 mg via ORAL
  Filled 2018-07-08 (×2): qty 1

## 2018-07-08 MED ORDER — CLOPIDOGREL BISULFATE 300 MG PO TABS
300.0000 mg | ORAL_TABLET | Freq: Once | ORAL | Status: AC
  Administered 2018-07-08: 300 mg via ORAL
  Filled 2018-07-08: qty 1

## 2018-07-08 MED ORDER — HEART ATTACK BOUNCING BOOK
Freq: Once | Status: DC
  Filled 2018-07-08: qty 1

## 2018-07-08 MED ORDER — APIXABAN 5 MG PO TABS
5.0000 mg | ORAL_TABLET | Freq: Two times a day (BID) | ORAL | Status: DC
  Administered 2018-07-08 – 2018-07-11 (×6): 5 mg via ORAL
  Filled 2018-07-08 (×6): qty 1

## 2018-07-08 MED FILL — Heparin Sod (Porcine)-NaCl IV Soln 1000 Unit/500ML-0.9%: INTRAVENOUS | Qty: 500 | Status: AC

## 2018-07-08 MED FILL — Nitroglycerin IV Soln 100 MCG/ML in D5W: INTRA_ARTERIAL | Qty: 10 | Status: AC

## 2018-07-08 NOTE — Progress Notes (Signed)
Pt arrived to unit with 10/10 chest pain in Aflutter w/ rate in 130s- hypertensive. Post Cath EKG still showed ST-Elevation and MD Gwenlyn Found is aware. MD Gwenlyn Found reports that it is expected for patient to continue to experience chest pain until infarct is complete. This RN has attempted to keep pt comfortable by administering ordered PRN morphine and tylenol. Both have been ineffective for pt's pain control. Pt then requested 100 mg of tramadol and medication was requested of MD Means while he was at the bedside(0020). Order was placed and medication was given without relief. Pt's condition was also reported to MD Means at this time. No new orders for treatment were given.   2345- pt attempted to use bedside commode but became frustrated that it was too small and uncomfortable and pt was then assisted to the bathroom. Pt complained of pain in peri area due to recent surgical procedure and became agitated and requested to speak with leadership. Attempts were made to calm and reassure pt and Charge RN was notified. Upon returning to pt's room at [place] with morphine, pt apologized for what he says "being rude". Charge RN then spoke with pt to ensure there weren't any concerns and pt verbalized that he didn't mean to be rude, but was upset that it took him a while to get to the bathroom.    0300-pt now resting comfortably(asleep) but still says he's having chest pain on assessment.

## 2018-07-08 NOTE — Progress Notes (Signed)
CARDIAC REHAB PHASE I   PRE:  Rate/Rhythm: 81 SR  BP:  Supine: 113/82  Sitting:   Standing:    SaO2: 98%RA  MODE:  Ambulation: bathroom and chair for a couple of minutes  ft   POST:  Rate/Rhythm: 91 SR  BP:  Supine:   Sitting: 127/91  Standing:    SaO2: 95%r RA 1325-1400 Pt sleeping. Came to see to walk and get OOB. Pt went to bathroom and then to recliner for few minutes. Could not get comfortable in recliner due to scrotum. Assisted back to bed. Pt stated he was to sleepy to try to walk in hall. Pt stated he had cut down on cigarettes and does not plan on smoking anymore. Discussed with pt that he will be on plavix and not brilinta.   Graylon Good, RN BSN  07/08/2018 1:53 PM

## 2018-07-08 NOTE — Plan of Care (Signed)
  Problem: Education: Goal: Knowledge of General Education information will improve Outcome: Progressing   Problem: Health Behavior/Discharge Planning: Goal: Ability to manage health-related needs will improve Outcome: Progressing   Problem: Clinical Measurements: Goal: Ability to maintain clinical measurements within normal limits will improve Outcome: Progressing Goal: Will remain free from infection Outcome: Progressing Goal: Diagnostic test results will improve Outcome: Progressing Goal: Respiratory complications will improve Outcome: Progressing Goal: Cardiovascular complication will be avoided Outcome: Progressing   Problem: Activity: Goal: Risk for activity intolerance will decrease Outcome: Progressing   Problem: Nutrition: Goal: Adequate nutrition will be maintained Outcome: Progressing   Problem: Elimination: Goal: Will not experience complications related to bowel motility Outcome: Progressing Goal: Will not experience complications related to urinary retention Outcome: Progressing   Problem: Safety: Goal: Ability to remain free from injury will improve Outcome: Progressing   Problem: Skin Integrity: Goal: Risk for impaired skin integrity will decrease Outcome: Progressing   Problem: Education: Goal: Understanding of cardiac disease, CV risk reduction, and recovery process will improve Outcome: Progressing Goal: Understanding of medication regimen will improve Outcome: Progressing   Problem: Activity: Goal: Ability to tolerate increased activity will improve Outcome: Progressing   Problem: Cardiac: Goal: Ability to achieve and maintain adequate cardiopulmonary perfusion will improve Outcome: Progressing   Problem: Health Behavior/Discharge Planning: Goal: Ability to safely manage health-related needs after discharge will improve Outcome: Progressing   Problem: Coping: Goal: Level of anxiety will decrease Outcome: Not Progressing   Problem:  Pain Managment: Goal: General experience of comfort will improve Outcome: Not Progressing   Problem: Cardiac: Goal: Vascular access site(s) Level 0-1 will be maintained Outcome: Completed/Met

## 2018-07-08 NOTE — Progress Notes (Signed)
CRITICAL VALUE ALERT  Critical Value:  Troponin=4.19  Date & Time Notied:  07/08/18 0240  Provider Notified: MD Means  No new orders

## 2018-07-08 NOTE — Progress Notes (Signed)
Smoking Cessation information printed for patient and placed at the patient's bedside. RN will provide smoking cessation education in AM.

## 2018-07-08 NOTE — Progress Notes (Signed)
Patient converted from A-flutter to NSR at 0335.

## 2018-07-08 NOTE — Progress Notes (Signed)
  Echocardiogram 2D Echocardiogram has been performed.  Jeremy Raymond 07/08/2018, 9:00 AM

## 2018-07-08 NOTE — Progress Notes (Signed)
Progress Note  Patient Name: Jeremy Raymond Date of Encounter: 07/08/2018  Primary Cardiologist: Rozann Lesches, MD   Subjective   Feeling better this morning.  Complains of mild chest discomfort.  No shortness of breath.  He remains on Aggrastat and IV nitroglycerin drips.  Inpatient Medications    Scheduled Meds: . aspirin  81 mg Oral Daily  . atorvastatin  80 mg Oral q1800  . heart attack bouncing book   Does not apply Once  . sodium chloride flush  3 mL Intravenous Q12H  . ticagrelor  90 mg Oral BID   Continuous Infusions: . sodium chloride    . amiodarone 30 mg/hr (07/08/18 0600)  . nitroGLYCERIN 15 mcg/min (07/08/18 0600)  . tirofiban 0.15 mcg/kg/min (07/08/18 0600)   PRN Meds: sodium chloride, acetaminophen, morphine injection, ondansetron (ZOFRAN) IV, sodium chloride flush   Vital Signs    Vitals:   07/08/18 0400 07/08/18 0500 07/08/18 0600 07/08/18 0700  BP: (!) 122/99 111/86 115/89 (!) 120/92  Pulse: 75 78 73 75  Resp: 12 17 16 18   Temp:    97.9 F (36.6 C)  TempSrc:    Oral  SpO2: 97% 97% 97% 96%  Weight:  235 lb 0.2 oz (106.6 kg)    Height:        Intake/Output Summary (Last 24 hours) at 07/08/2018 0857 Last data filed at 07/08/2018 0600 Gross per 24 hour  Intake 1761.7 ml  Output -  Net 1761.7 ml   Filed Weights   07/07/18 2000 07/08/18 0500  Weight: 234 lb 12.6 oz (106.5 kg) 235 lb 0.2 oz (106.6 kg)    Telemetry    Converted from atrial flutter to normal sinus rhythm at 0 330 this morning, now maintaining sinus rhythm heart rate 70 bpm- Personally Reviewed  ECG    This morning's EKG demonstrates normal sinus rhythm 75 bpm, right bundle branch block, age-indeterminate inferior MI- Personally Reviewed  Physical Exam  Alert, oriented male in no distress GEN: No acute distress.   Neck: No JVD Cardiac: RRR, no murmurs, rubs, or gallops.  Respiratory: Clear to auscultation bilaterally. GI: Soft, nontender, non-distended  MS: No edema;  No deformity.  Right radial site clear Neuro:  Nonfocal  Psych: Normal affect   Labs    Chemistry Recent Labs  Lab 07/07/18 1808 07/07/18 1809 07/08/18 0656  NA 132* 135 134*  K 3.5 3.6 4.0  CL 102 100 102  CO2 20*  --  22  GLUCOSE 145* 144* 98  BUN 12 12 9   CREATININE 1.19 1.10 1.03  CALCIUM 8.3*  --  8.6*  PROT 5.7*  --   --   ALBUMIN 2.6*  --   --   AST 21  --   --   ALT 31  --   --   ALKPHOS 137*  --   --   BILITOT 0.6  --   --   GFRNONAA >60  --  >60  GFRAA >60  --  >60  ANIONGAP 10  --  10     Hematology Recent Labs  Lab 07/07/18 1808 07/07/18 1809 07/08/18 0656  WBC 7.0  --  6.6  RBC 3.78*  --  3.67*  HGB 11.2* 11.2* 10.9*  HCT 34.9* 33.0* 34.4*  MCV 92.3  --  93.7  MCH 29.6  --  29.7  MCHC 32.1  --  31.7  RDW 13.1  --  13.3  PLT 269  --  261    Cardiac Enzymes  Recent Labs  Lab 07/07/18 1740 07/07/18 1808 07/08/18 0109 07/08/18 0656  TROPONINI 0.03* 0.03* 4.19* 13.58*   No results for input(s): TROPIPOC in the last 168 hours.   BNPNo results for input(s): BNP, PROBNP in the last 168 hours.   DDimer No results for input(s): DDIMER in the last 168 hours.   Radiology    No results found.  Cardiac Studies   Cardiac Cath 07-07-2018: Conclusion     Previously placed Ost LAD to Prox LAD stent (unknown type) is widely patent.  Prox Cx lesion is 50% stenosed.  Ost 2nd Diag to 2nd Diag lesion is 60% stenosed.  Previously placed Prox RCA to Mid RCA stent (unknown type) is widely patent.  Prox RCA lesion is 40% stenosed.  Mid RCA lesion is 40% stenosed.  Dist Cx lesion is 100% stenosed.  Post intervention, there is a 0% residual stenosis.   Jeremy Raymond is a 57 y.o. male    [id number] LOCATION:  FACILITY: [facility]  PHYSICIAN: Quay Burow, M.D. September 09, 1961   DATE OF PROCEDURE:  07/07/2018  DATE OF DISCHARGE:     CARDIAC CATHETERIZATION / PCI Distal LCX PLA     History obtained from chart review.Jeremy Raymond a 57 y.o.malewith a history of CAD s/p recent STEMI 2 weeks ago with PCI to the RCA and staged PCI to the LAD, [facility] withinferior STEMI,new atrial flutter and concern foracute in-sent thrombosis.     PROCEDURE DESCRIPTION:   The patient was brought to the second floor [facility]iac cath lab in the postabsorptive state. He was not premedicated . His right wrist was prepped and shaved in usual sterile fashion. Xylocaine 1% was used for local anesthesia. A 6 French sheath was inserted into the right radial  artery using standard Seldinger technique. The patient received 4000 units  of heparin intravenously.  A 5 [place] TIG catheter was used for selective coronary angiography.  A 5 French pigtail catheter was used for left heart pressures.  Isovue dye was used for the entirety of the case.  Retrograde aorta, left ventricular and pullback pressures were recorded.  Radial cocktail was administered via the SideArm sheath.  The culprit lesion was a distal circumflex posterolateral branch.  The previously placed proximal LAD and mid RCA stent were widely patent.  Patient did have A. fib with RVR and was bolused with amiodarone and placed on a drip.  Angiomax bolus followed by infusion was administered with a therapeutic ACT.  Using a 6 [place] XB 4 guide catheter along with a 0.14 pro-water guidewire and a 2 mm x 12 mm balloon occluded PLA branch was crossed with a wire and ballooned with a 2 mm balloon.  This provided antegrade flow however the vessel beyond the occlusion was diffusely diseased and had a distal distal outflow tract occlusion making further intervention futile.  The case was then terminated.  Patient did have continued ongoing chest pain and was borderline hypotensive.  He received 250 cc of fluid bolus IV.   IMPRESSION: Mr. Cass is now 1 month status post staged mid RCA and proximal LAD PCI and drug-eluting stenting by Drs. Collins Kerby and End.  He  apparently has been compliant with his antiplatelet therapy.  He developed chest pain this afternoon.  His EKG shows inferior ST segment elevation.  He is a new A. fib flutter with rapid ventricular response with a borderline blood pressure.  The occlusion appeared embolic.  He had PTCA of the posterior lateral branch  with restoration of antegrade flow but with a diffusely diseased vessel beyond this making further intervention not appropriate.  The patient was bolused with amiodarone and placed on a drip.  Given the thrombotic nature of the occlusion he will be treated with Aggrastat for 18 hours IV.  The sheath was removed and a TR band was placed on the right wrist to achieve patent hemostasis.  The patient left the lab in stable condition.  Quay Burow. MD, [facility] 07/07/2018 6:35 PM      Recommend uninterrupted dual antiplatelet therapy with Aspirin 81mg  daily and Ticagrelor 90mg  twice daily for a minimum of 12 months (ACS - Class I recommendation).  Indications   Acute ST elevation myocardial infarction (STEMI) involving other coronary artery of inferior wall (HCC) [I21.19 (ICD-10-CM)]  Procedural Details/Technique   Technical Details Estimated blood loss <50 mL.  During this procedure no sedation was administered.  Coronary Findings   Diagnostic  Dominance: Right  Left Anterior Descending  Ost LAD to Prox LAD lesion 0% stenosed  Previously placed Ost LAD to Prox LAD stent (unknown type) is widely patent.  Second Diagonal [employer] 2nd Diag to 2nd Diag lesion 60% stenosed  Ost 2nd Diag to 2nd Diag lesion is 60% stenosed.  Left Circumflex  Prox Cx lesion 50% stenosed  Prox Cx lesion is 50% stenosed.  Dist Cx lesion 100% stenosed  Dist Cx lesion is 100% stenosed.  Right Coronary Artery  Prox RCA lesion 40% stenosed  Prox RCA lesion is 40% stenosed.  Prox RCA to Mid RCA lesion 0% stenosed  Previously placed Prox RCA to Mid RCA stent (unknown type) is widely patent.    Mid RCA lesion 40% stenosed  Mid RCA lesion is 40% stenosed.  Intervention   Dist Cx lesion  Angioplasty  Post-Intervention Lesion Assessment  The intervention was successful. Pre-interventional TIMI flow is 0. Post-intervention TIMI flow is 2. At this lesion, a arterial obstruction occurred.  There is no residual stenosis post intervention.  Coronary Diagrams   Diagnostic Diagram       Post-Intervention Diagram          Patient Profile     57 y.o. male with recent inferior STEMI secondary to PDA occlusion who underwent stenting of the RCA 6/20 then staged PCI of the proximal LAD, now presenting with recurrent inferolateral STEMI and atrial flutter with RVR.   Assessment & Plan    1. Inferolateral STEMI: secondary to acute occlusion of the second OM branch of the circumflex. RCA and LAD stents patent. Suspect hypercoagulability/possible embolism. PTCA performed yesterday acutely.  pt now with recurrent MI and evidence of embolism in setting of atrial flutter. Anticoagulation is indicated. Recommend change ticagrelor to clopidogrel and start direct oral anticoagulation this evening. aggrastat to stop at about noon today.     2. Atrial flutter with RVR: converted to sinus rhythm overnight. Start oral anticoagulation this evening.   Dispo: keep in CCU today. aggrastat to stop around noon. Load with plavix 300 mg, stop ticagrelor. Plan ASA 81 mg x 1 month.   For questions or updates, please contact [place] Please consult [email or web address] for contact info under Cardiology/STEMI.   Signed, Sherren Mocha, MD  07/08/2018, 8:57 AM

## 2018-07-09 ENCOUNTER — Encounter (HOSPITAL_COMMUNITY): Payer: Self-pay | Admitting: Emergency Medicine

## 2018-07-09 DIAGNOSIS — I4891 Unspecified atrial fibrillation: Secondary | ICD-10-CM

## 2018-07-09 LAB — BASIC METABOLIC PANEL
ANION GAP: 8 (ref 5–15)
BUN: 8 mg/dL (ref 6–20)
CALCIUM: 8.6 mg/dL — AB (ref 8.9–10.3)
CO2: 23 mmol/L (ref 22–32)
CREATININE: 0.97 mg/dL (ref 0.61–1.24)
Chloride: 103 mmol/L (ref 98–111)
Glucose, Bld: 89 mg/dL (ref 70–99)
Potassium: 4.1 mmol/L (ref 3.5–5.1)
SODIUM: 134 mmol/L — AB (ref 135–145)

## 2018-07-09 MED ORDER — AMIODARONE LOAD VIA INFUSION
150.0000 mg | Freq: Once | INTRAVENOUS | Status: AC
  Administered 2018-07-09: 150 mg via INTRAVENOUS
  Filled 2018-07-09: qty 83.34

## 2018-07-09 MED ORDER — METOPROLOL TARTRATE 25 MG PO TABS
25.0000 mg | ORAL_TABLET | Freq: Two times a day (BID) | ORAL | Status: DC
  Administered 2018-07-09 – 2018-07-11 (×5): 25 mg via ORAL
  Filled 2018-07-09 (×5): qty 1

## 2018-07-09 MED ORDER — AMIODARONE HCL IN DEXTROSE 360-4.14 MG/200ML-% IV SOLN
30.0000 mg/h | INTRAVENOUS | Status: DC
  Administered 2018-07-09 – 2018-07-10 (×2): 30 mg/h via INTRAVENOUS
  Filled 2018-07-09 (×2): qty 200

## 2018-07-09 MED ORDER — AMIODARONE HCL IN DEXTROSE 360-4.14 MG/200ML-% IV SOLN
60.0000 mg/h | INTRAVENOUS | Status: AC
  Administered 2018-07-09: 60 mg/h via INTRAVENOUS
  Filled 2018-07-09 (×2): qty 200

## 2018-07-09 NOTE — Progress Notes (Signed)
CARDIAC REHAB PHASE I   PRE:  Rate/Rhythm: 101-130's  BP:  Supine: 82/62, 75/52 left arm  Sitting:   Standing:    SaO2: 94%RA  MODE:  Ambulation: 0 ft   1130-1147 Came to see pt to walk. Heart rate atrial fib 101 to 130's. Took BP several times left arm with low readings. Pt denied feeling bad or dizzy. Will hold ambulation at this time. Notified RN.     Graylon Good, RN BSN  07/09/2018 11:44 AM

## 2018-07-09 NOTE — Progress Notes (Signed)
Progress Note  Patient Name: Jeremy Raymond Date of Encounter: 07/09/2018  Primary Cardiologist: Rozann Lesches, MD   Subjective   Feeling okay.  Had some chest discomfort this morning when he went into atrial fibrillation with RVR.  No shortness of breath or chest pain at present.  Sinus rhythm until approximately  Inpatient Medications    Scheduled Meds: . apixaban  5 mg Oral BID  . aspirin  81 mg Oral Daily  . atorvastatin  80 mg Oral q1800  . clopidogrel  75 mg Oral Daily  . heart attack bouncing book   Does not apply Once  . metoprolol tartrate  25 mg Oral BID  . sodium chloride flush  3 mL Intravenous Q12H   Continuous Infusions: . sodium chloride     PRN Meds: sodium chloride, acetaminophen, morphine injection, ondansetron (ZOFRAN) IV, sodium chloride flush   Vital Signs    Vitals:   07/09/18 0756 07/09/18 0900 07/09/18 0922 07/09/18 1000  BP: (!) 127/93  103/66   Pulse:      Resp: 15 13 20  (!) 22  Temp:      TempSrc:      SpO2:      Weight:      Height:        Intake/Output Summary (Last 24 hours) at 07/09/2018 1128 Last data filed at 07/09/2018 0400 Gross per 24 hour  Intake 525.12 ml  Output -  Net 525.12 ml   Filed Weights   07/07/18 2000 07/08/18 0500  Weight: 234 lb 12.6 oz (106.5 kg) 235 lb 0.2 oz (106.6 kg)    Telemetry    6:45 AM today when he developed atrial fibrillation with RVR.  Currently in atrial fibrillation with controlled ventricular rate about 90 bpm- Personally Reviewed   Physical Exam  Alert, oriented male in no distress GEN: No acute distress.   Neck: No JVD Cardiac:  Irregularly irregular, no murmurs, rubs, or gallops.  Respiratory: Clear to auscultation bilaterally. GI: Soft, nontender, non-distended  MS: No edema; No deformity. Neuro:  Nonfocal  Psych: Normal affect   Labs    Chemistry Recent Labs  Lab 07/07/18 1808 07/07/18 1809 07/08/18 0656 07/09/18 0320  NA 132* 135 134* 134*  K 3.5 3.6 4.0 4.1    CL 102 100 102 103  CO2 20*  --  22 23  GLUCOSE 145* 144* 98 89  BUN 12 12 9 8   CREATININE 1.19 1.10 1.03 0.97  CALCIUM 8.3*  --  8.6* 8.6*  PROT 5.7*  --   --   --   ALBUMIN 2.6*  --   --   --   AST 21  --   --   --   ALT 31  --   --   --   ALKPHOS 137*  --   --   --   BILITOT 0.6  --   --   --   GFRNONAA >60  --  >60 >60  GFRAA >60  --  >60 >60  ANIONGAP 10  --  10 8     Hematology Recent Labs  Lab 07/07/18 1808 07/07/18 1809 07/08/18 0656  WBC 7.0  --  6.6  RBC 3.78*  --  3.67*  HGB 11.2* 11.2* 10.9*  HCT 34.9* 33.0* 34.4*  MCV 92.3  --  93.7  MCH 29.6  --  29.7  MCHC 32.1  --  31.7  RDW 13.1  --  13.3  PLT 269  --  261  Cardiac Enzymes Recent Labs  Lab 07/07/18 1740 07/07/18 1808 07/08/18 0109 07/08/18 0656  TROPONINI 0.03* 0.03* 4.19* 13.58*   No results for input(s): TROPIPOC in the last 168 hours.   BNPNo results for input(s): BNP, PROBNP in the last 168 hours.   DDimer No results for input(s): DDIMER in the last 168 hours.   Radiology    No results found.  Cardiac Studies   2D echocardiogram 07/08/2018: Study Conclusions  - Left ventricle: The cavity size was normal. Systolic function was   mildly reduced. The estimated ejection fraction was in the range   of 45% to 50%. Hypokinesis of the basal-midinferoseptal   myocardium. Doppler parameters are consistent with abnormal left   ventricular relaxation (grade 1 diastolic dysfunction).  Impressions:  - Hypokinesis of the basal-mid septum, similar to prior.  Patient Profile     57 y.o. male with recent inferior STEMI secondary to PDA occlusion who underwent stenting of the RCA 6/20 then staged PCI of the proximal LAD, now presenting with recurrent inferolateral STEMI and atrial flutter with RVR.  Assessment & Plan    1.  Inferolateral STEMI: Patient with multivessel coronary artery disease, now presented with acute occlusion of the second OM branch of the circumflex.  Recently placed  RCA and LAD stents are patent.  Patient now treated with aspirin and clopidogrel.  2.  Atrial fibrillation with RVR: The patient has been in atrial flutter and atrial fibrillation, now back in atrial fibrillation this morning.  Oral anticoagulation with apixaban has been initiated and there is a chance that his coronary events have been related to cardiac source of embolus with atrial fibrillation.  We will start him on amiodarone in an attempt to control his atrial fibrillation.  3.  Hyperlipidemia: Continue high intensity statin drug  Disposition: Keep in CCU this morning while we are starting him on IV amiodarone.  Consider transfer later today or tomorrow morning pending his clinical stability.  For questions or updates, please contact [place] Please consult [email or web address] for contact info under Cardiology/STEMI.      Signed, Sherren Mocha, MD  07/09/2018, 11:28 AM

## 2018-07-10 DIAGNOSIS — I481 Persistent atrial fibrillation: Secondary | ICD-10-CM

## 2018-07-10 LAB — CBC
HEMATOCRIT: 36.4 % — AB (ref 39.0–52.0)
HEMOGLOBIN: 11.6 g/dL — AB (ref 13.0–17.0)
MCH: 29.7 pg (ref 26.0–34.0)
MCHC: 31.9 g/dL (ref 30.0–36.0)
MCV: 93.1 fL (ref 78.0–100.0)
Platelets: 258 10*3/uL (ref 150–400)
RBC: 3.91 MIL/uL — AB (ref 4.22–5.81)
RDW: 13.3 % (ref 11.5–15.5)
WBC: 6.6 10*3/uL (ref 4.0–10.5)

## 2018-07-10 LAB — BASIC METABOLIC PANEL
Anion gap: 10 (ref 5–15)
BUN: 8 mg/dL (ref 6–20)
CHLORIDE: 103 mmol/L (ref 98–111)
CO2: 23 mmol/L (ref 22–32)
CREATININE: 0.98 mg/dL (ref 0.61–1.24)
Calcium: 8.9 mg/dL (ref 8.9–10.3)
GFR calc Af Amer: >60 mL/min (ref >60)
GFR calc non Af Amer: >60 mL/min (ref >60)
Glucose, Bld: 96 mg/dL (ref 70–99)
POTASSIUM: 3.8 mmol/L (ref 3.5–5.1)
SODIUM: 136 mmol/L (ref 135–145)

## 2018-07-10 MED ORDER — PANTOPRAZOLE SODIUM 40 MG PO TBEC
40.0000 mg | DELAYED_RELEASE_TABLET | Freq: Every day | ORAL | Status: DC
  Administered 2018-07-10 – 2018-07-11 (×2): 40 mg via ORAL
  Filled 2018-07-10 (×2): qty 1

## 2018-07-10 MED ORDER — AMIODARONE HCL 200 MG PO TABS: 400.0000 mg | ORAL_TABLET | Freq: Two times a day (BID) | ORAL | Status: DC

## 2018-07-10 MED ORDER — AMIODARONE HCL 200 MG PO TABS
400.0000 mg | ORAL_TABLET | Freq: Two times a day (BID) | ORAL | Status: DC
  Administered 2018-07-10 – 2018-07-11 (×3): 400 mg via ORAL
  Filled 2018-07-10 (×3): qty 2

## 2018-07-10 MED ORDER — MAGNESIUM HYDROXIDE 400 MG/5ML PO SUSP
30.0000 mL | Freq: Every day | ORAL | Status: AC
  Administered 2018-07-10: 30 mL via ORAL
  Filled 2018-07-10: qty 30

## 2018-07-10 NOTE — Progress Notes (Signed)
Per insurance check on Eliquis  # 5.  S/W ERIC @ [facility] # [phone]   ELIQUIS 5 MG BID  COVER- YES  CO-PAY- ZERO DOLLARS  TIER- NO  PRIOR APPROVAL- NO   PREFERRED PHARMACY : YES  [facility]

## 2018-07-10 NOTE — Plan of Care (Signed)
  Problem: Clinical Measurements: Goal: Diagnostic test results will improve Outcome: Progressing   Problem: Elimination: Goal: Will not experience complications related to bowel motility Outcome: Progressing   Problem: Education: Goal: Understanding of cardiac disease, CV risk reduction, and recovery process will improve Outcome: Progressing Goal: Understanding of medication regimen will improve Outcome: Progressing   Problem: Cardiac: Goal: Ability to achieve and maintain adequate cardiopulmonary perfusion will improve Outcome: Progressing

## 2018-07-10 NOTE — Progress Notes (Addendum)
Progress Note  Patient Name: Jeremy Raymond Date of Encounter: 07/10/2018  Primary Cardiologist: Rozann Lesches, MD   Subjective   Doing well today. No chest pain or dyspnea.   Inpatient Medications    Scheduled Meds: . apixaban  5 mg Oral BID  . aspirin  81 mg Oral Daily  . atorvastatin  80 mg Oral q1800  . clopidogrel  75 mg Oral Daily  . heart attack bouncing book   Does not apply Once  . metoprolol tartrate  25 mg Oral BID  . sodium chloride flush  3 mL Intravenous Q12H   Continuous Infusions: . sodium chloride    . amiodarone 30 mg/hr (07/10/18 0739)   PRN Meds: sodium chloride, acetaminophen, morphine injection, ondansetron (ZOFRAN) IV, sodium chloride flush   Vital Signs    Vitals:   07/10/18 0500 07/10/18 0600 07/10/18 0700 07/10/18 0745  BP:  (!) 89/62    Pulse:      Resp: 13 19 18    Temp:    (!) 97.4 F (36.3 C)  TempSrc:    Oral  SpO2:      Weight:      Height:        Intake/Output Summary (Last 24 hours) at 07/10/2018 1022 Last data filed at 07/10/2018 0700 Gross per 24 hour  Intake 389.15 ml  Output -  Net 389.15 ml   Filed Weights   07/07/18 2000 07/08/18 0500  Weight: 234 lb 12.6 oz (106.5 kg) 235 lb 0.2 oz (106.6 kg)    Telemetry    NSR HR in the 70s - Personally Reviewed  ECG    07/09/18 Afib w/ RVR 111 bpm - Personally Reviewed  Physical Exam   GEN: No acute distress.  Moderately obese  Neck: No JVD Cardiac: RRR, no murmurs, rubs, or gallops.  Respiratory: Clear to auscultation bilaterally. GI: Soft, nontender, non-distended  MS: No edema; No deformity. Neuro:  Nonfocal  Psych: Normal affect   Labs    Chemistry Recent Labs  Lab 07/07/18 1808  07/08/18 0656 07/09/18 0320 07/10/18 0202  NA 132*   < > 134* 134* 136  K 3.5   < > 4.0 4.1 3.8  CL 102   < > 102 103 103  CO2 20*  --  22 23 23   GLUCOSE 145*   < > 98 89 96  BUN 12   < > 9 8 8   CREATININE 1.19   < > 1.03 0.97 0.98  CALCIUM 8.3*  --  8.6* 8.6* 8.9    PROT 5.7*  --   --   --   --   ALBUMIN 2.6*  --   --   --   --   AST 21  --   --   --   --   ALT 31  --   --   --   --   ALKPHOS 137*  --   --   --   --   BILITOT 0.6  --   --   --   --   GFRNONAA >60  --  >60 >60 >60  GFRAA >60  --  >60 >60 >60  ANIONGAP 10  --  10 8 10    < > = values in this interval not displayed.     Hematology Recent Labs  Lab 07/07/18 1808 07/07/18 1809 07/08/18 0656 07/10/18 0202  WBC 7.0  --  6.6 6.6  RBC 3.78*  --  3.67* 3.91*  HGB 11.2* 11.2*  10.9* 11.6*  HCT 34.9* 33.0* 34.4* 36.4*  MCV 92.3  --  93.7 93.1  MCH 29.6  --  29.7 29.7  MCHC 32.1  --  31.7 31.9  RDW 13.1  --  13.3 13.3  PLT 269  --  261 258    Cardiac Enzymes Recent Labs  Lab 07/07/18 1740 07/07/18 1808 07/08/18 0109 07/08/18 0656  TROPONINI 0.03* 0.03* 4.19* 13.58*   No results for input(s): TROPIPOC in the last 168 hours.   BNPNo results for input(s): BNP, PROBNP in the last 168 hours.   DDimer No results for input(s): DDIMER in the last 168 hours.   Radiology    No results found.  Cardiac Studies   [facility]  07/07/18  Previously placed Ost LAD to Prox LAD stent (unknown type) is widely patent.  Prox Cx lesion is 50% stenosed.  Ost 2nd Diag to 2nd Diag lesion is 60% stenosed.  Previously placed Prox RCA to Mid RCA stent (unknown type) is widely patent.  Prox RCA lesion is 40% stenosed.  Mid RCA lesion is 40% stenosed.  Dist Cx lesion is 100% stenosed.  Post intervention, there is a 0% residual stenosis. The culprit lesion was a distal circumflex posterolateral branch.  The previously placed proximal LAD and mid RCA stent were widely patent. The occlusion appeared embolic.  He had PTCA of the posterior lateral branch with restoration of antegrade flow but with a diffusely diseased vessel beyond this making further intervention not appropriate.  The patient was bolused with amiodarone and placed on a drip.  Given the thrombotic nature of the occlusion he will be  treated with Aggrastat for 18 hours IV.    2D echocardiogram 07/08/2018: Study Conclusions  - Left ventricle: The cavity size was normal. Systolic function was mildly reduced. The estimated ejection fraction was in the range of 45% to 50%. Hypokinesis of the basal-midinferoseptal myocardium. Doppler parameters are consistent with abnormal left ventricular relaxation (grade 1 diastolic dysfunction).  Impressions:  - Hypokinesis of the basal-mid septum, similar to prior.    Patient Profile     57 y.o. male with recent inferior STEMI secondary to PDA occlusion who underwent stenting of the RCA 6/20 then staged PCI of the proximal LAD, readmitted 07/07/18 with another inferolateral STEMI and new atrial flutter with RVR.  Assessment & Plan    1. CAD S/p Myocardial Infarction x 2: Initial STEMI 06/18/18, treated with staged mid RCA and proximal LAD PCI and drug-eluting stenting. Readmitted with 2nd inferior STEMI 07/07/18. The culprit lesion was a distal circumflex posterolateral branch.  The previously placed proximal LAD and mid RCA stent were widely patent. The occlusion appeared embolic.  He had PTCA of the posterior lateral branch with restoration of antegrade flow but with a diffusely diseased vessel beyond this, making further intervention not appropriate.  Given the thrombotic nature of the occlusion he was treated with Aggrastat for 18 hours IV.  DAPT continued. Brilinta changed to Plavix, ASA resumed, along with high intensity statin and BB. Also on Eliquis for afib/flutter. Duration of triple therapy TBD by MD. Suspect we will d/c ASA after 30 days. Will add Protonix 40 mg for GI protection. Plan to ambulate with cardiac rehab today. Likely d/c in the next 24 hrs.   2. Atrial Flutter/Fibrillarion: new diagnosis, discovered on admit. Initially with RVR. Required IV amiodarone bolus + drip during emergent cardiac cath for STEMI. Continued on BB. Eliquis, 5 mg BID, initiated for  anticoagulation. There is a chance that  his coronary events have been related to cardiac source of embolus with atrial fibrillation. He is now back in NSR. We will convert to PO amiodarone today.   3. HLD: controlled with high dose statin. LDL improved from 76 mg/dL to now 38 mg/dL. Continue Lipitor 80 mg nightly.    For questions or updates, please contact [place] Please consult [email or web address] for contact info under Cardiology/STEMI.   Patient seen, examined. Available data reviewed. Agree with findings, assessment, and plan as outlined by Lyda Jester, PA-C.  On my exam the patient is alert and oriented, in no distress.  Lung fields are clear, JVP is normal, heart is regular rate and rhythm with no murmur or gallops, abdomen is soft and nontender, extremities show no edema.  The patient has converted to normal sinus rhythm on IV amiodarone.  We will convert him to oral amiodarone today and I would anticipate discharge home tomorrow as long as he is stable and in sinus rhythm.  As previously outlined, he will be on a combination of apixaban, low-dose aspirin limited to 30 days, and clopidogrel.  Sherren Mocha, M.D. 07/10/2018 12:10 PM     Signed, Lyda Jester, PA-C  07/10/2018, 10:22 AM

## 2018-07-10 NOTE — Progress Notes (Signed)
Pt converted to NSR at this time. On 30 mg/hr IV amiodarone. Will continue to monitor.  Sherlie Ban, RN

## 2018-07-10 NOTE — Progress Notes (Signed)
CARDIAC REHAB PHASE I   PRE:  Rate/Rhythm: 72 SR  BP:  Supine: 109/80  Sitting:   Standing:    SaO2: 96%RA  MODE:  Ambulation: 740 ft   POST:  Rate/Rhythm: 82 SR  BP:  Supine: 125/98,  116/87  Sitting:   Standing:    SaO2: 98%RA 1040-1110 Pt walked 740 ft on RA with steady gait and no CP. Remained in NSR. Pt was educated last admission and stated he had no questions. Discussed that he is going to be on plavix and not brilinta. Encouraged no smoking. Reviewed NTG use, encouraged healthy food choices and made referral again to [facility] CRP 2. Gave pt OFF the Beat booklet and discussed why he had to have anticoagulation.  Pt voiced understanding,.   Graylon Good, RN BSN  07/10/2018 11:05 AM

## 2018-07-10 NOTE — Discharge Instructions (Addendum)
·   You will remain on Aspirin 81 mg Once daily for 30 days, then stop.  You will stop Aspirin after 08/06/18.  Do not miss a dose of Eliquis or Plavix.  Plavix takes the place of Brilinta.  Do not take Brilinta (Ticagrelor) anymore.  You are now on Metoprolol Tartrate.  This takes the place of Carvedilol (Coreg).  Do not take Coreg anymore.   Remain off of Isosorbide mononitrate, Lisinopril for now.  Review this with Dr. Domenic Polite at follow up to see if you need to restart either of them.  Amiodarone is a new medication to keep you in normal rhythm.  You will take 2 tablets (400 mg) twice daily for 5 days after discharge, then take 1 tablet twice daily until you see Dr. Domenic Polite back.   Information on my medicine - ELIQUIS (apixaban)  This medication education was reviewed with me or my healthcare representative as part of my discharge preparation.  The pharmacist that spoke with me during my hospital stay was:  Pat Patrick, [facility]  Why was Eliquis prescribed for you? Eliquis was prescribed for you to reduce the risk of a blood clot forming that can cause a stroke if you have a medical condition called atrial fibrillation (a type of irregular heartbeat).  What do You need to know about Eliquis ? Take your Eliquis TWICE DAILY - one tablet in the morning and one tablet in the evening with or without food. If you have difficulty swallowing the tablet whole please discuss with your pharmacist how to take the medication safely.  Take Eliquis exactly as prescribed by your doctor and DO NOT stop taking Eliquis without talking to the doctor who prescribed the medication.  Stopping may increase your risk of developing a stroke.  Refill your prescription before you run out.  After discharge, you should have regular check-up appointments with your healthcare provider that is prescribing your Eliquis.  In the future your dose may need to be changed if your kidney function or weight changes by a  significant amount or as you get older.  What do you do if you miss a dose? If you miss a dose, take it as soon as you remember on the same day and resume taking twice daily.  Do not take more than one dose of ELIQUIS at the same time to make up a missed dose.  Important Safety Information A possible side effect of Eliquis is bleeding. You should call your healthcare provider right away if you experience any of the following: ? Bleeding from an injury or your nose that does not stop. ? Unusual colored urine (red or dark brown) or unusual colored stools (red or black). ? Unusual bruising for unknown reasons. ? A serious fall or if you hit your head (even if there is no bleeding).  Some medicines may interact with Eliquis and might increase your risk of bleeding or clotting while on Eliquis. To help avoid this, consult your healthcare provider or pharmacist prior to using any new prescription or non-prescription medications, including herbals, vitamins, non-steroidal anti-inflammatory drugs (NSAIDs) and supplements.  This website has more information on Eliquis (apixaban): http://www.eliquis.com/eliquis/home

## 2018-07-11 ENCOUNTER — Encounter (HOSPITAL_COMMUNITY): Payer: Self-pay | Admitting: Physician Assistant

## 2018-07-11 DIAGNOSIS — C609 Malignant neoplasm of penis, unspecified: Secondary | ICD-10-CM

## 2018-07-11 DIAGNOSIS — I4891 Unspecified atrial fibrillation: Secondary | ICD-10-CM

## 2018-07-11 DIAGNOSIS — I5022 Chronic systolic (congestive) heart failure: Secondary | ICD-10-CM

## 2018-07-11 DIAGNOSIS — I25119 Atherosclerotic heart disease of native coronary artery with unspecified angina pectoris: Secondary | ICD-10-CM

## 2018-07-11 DIAGNOSIS — I480 Paroxysmal atrial fibrillation: Secondary | ICD-10-CM

## 2018-07-11 DIAGNOSIS — E782 Mixed hyperlipidemia: Secondary | ICD-10-CM

## 2018-07-11 HISTORY — DX: Chronic systolic (congestive) heart failure: I50.22

## 2018-07-11 MED ORDER — METOPROLOL TARTRATE 25 MG PO TABS: 25.0000 mg | ORAL_TABLET | Freq: Two times a day (BID) | ORAL | 11 refills | Status: AC

## 2018-07-11 MED ORDER — CLOPIDOGREL BISULFATE 75 MG PO TABS: 75.0000 mg | ORAL_TABLET | Freq: Every day | ORAL | 11 refills | Status: AC

## 2018-07-11 MED ORDER — ASPIRIN 81 MG PO TBEC: 81.0000 mg | DELAYED_RELEASE_TABLET | Freq: Every day | ORAL | 0 refills | Status: AC

## 2018-07-11 MED ORDER — APIXABAN 5 MG PO TABS: 5.0000 mg | ORAL_TABLET | Freq: Two times a day (BID) | ORAL | 11 refills | Status: AC

## 2018-07-11 MED ORDER — AMIODARONE HCL 200 MG PO TABS: ORAL_TABLET | ORAL | 11 refills | Status: DC

## 2018-07-11 NOTE — Progress Notes (Signed)
Progress Note  Patient Name: Jeremy Raymond Date of Encounter: 07/11/2018  Primary Cardiologist: Dr. Satira Sark  Subjective   No chest pain, shortness of breath, or palpitations.  No nausea or abdominal discomfort.  Inpatient Medications    Scheduled Meds: . amiodarone  400 mg Oral BID  . apixaban  5 mg Oral BID  . aspirin  81 mg Oral Daily  . atorvastatin  80 mg Oral q1800  . clopidogrel  75 mg Oral Daily  . heart attack bouncing book   Does not apply Once  . metoprolol tartrate  25 mg Oral BID  . pantoprazole  40 mg Oral Daily  . sodium chloride flush  3 mL Intravenous Q12H   Continuous Infusions: . sodium chloride     PRN Meds: sodium chloride, acetaminophen, morphine injection, ondansetron (ZOFRAN) IV, sodium chloride flush   Vital Signs    Vitals:   07/11/18 0027 07/11/18 0456 07/11/18 0613 07/11/18 0801  BP: 105/75 100/72  114/75  Pulse: 60 61  (!) 59  Resp: 18 20    Temp: 99.2 F (37.3 C) 98.6 F (37 C)  98.4 F (36.9 C)  TempSrc: Oral Oral  Oral  SpO2: 99% 97%  98%  Weight:   226 lb 4.8 oz (102.6 kg)   Height:        Intake/Output Summary (Last 24 hours) at 07/11/2018 0918 Last data filed at 07/10/2018 1700 Gross per 24 hour  Intake 280.76 ml  Output -  Net 280.76 ml   Filed Weights   07/07/18 2000 07/08/18 0500 07/11/18 0613  Weight: 234 lb 12.6 oz (106.5 kg) 235 lb 0.2 oz (106.6 kg) 226 lb 4.8 oz (102.6 kg)    Telemetry    Sinus rhythm. Personally reviewed.  ECG    Tracing from 07/09/2018 shows atrial for ablation with RVR, incomplete right bundle branch block, acute or recent inferior infarct pattern.  Personally reviewed.  Physical Exam   GEN: No acute distress.   Neck: No JVD. Cardiac: RRR, no murmur, rub, or gallop.  Respiratory: Nonlabored. Clear to auscultation bilaterally. GI: Soft, nontender, bowel sounds present. MS:  Trace ankle edema; No deformity. Neuro:  Nonfocal. Psych: Alert and oriented x 3. Normal  affect.  Labs    Chemistry Recent Labs  Lab 07/07/18 1808  07/08/18 0656 07/09/18 0320 07/10/18 0202  NA 132*   < > 134* 134* 136  K 3.5   < > 4.0 4.1 3.8  CL 102   < > 102 103 103  CO2 20*  --  22 23 23   GLUCOSE 145*   < > 98 89 96  BUN 12   < > 9 8 8   CREATININE 1.19   < > 1.03 0.97 0.98  CALCIUM 8.3*  --  8.6* 8.6* 8.9  PROT 5.7*  --   --   --   --   ALBUMIN 2.6*  --   --   --   --   AST 21  --   --   --   --   ALT 31  --   --   --   --   ALKPHOS 137*  --   --   --   --   BILITOT 0.6  --   --   --   --   GFRNONAA >60  --  >60 >60 >60  GFRAA >60  --  >60 >60 >60  ANIONGAP 10  --  10 8 10    < > =  values in this interval not displayed.     Hematology Recent Labs  Lab 07/07/18 1808 07/07/18 1809 07/08/18 0656 07/10/18 0202  WBC 7.0  --  6.6 6.6  RBC 3.78*  --  3.67* 3.91*  HGB 11.2* 11.2* 10.9* 11.6*  HCT 34.9* 33.0* 34.4* 36.4*  MCV 92.3  --  93.7 93.1  MCH 29.6  --  29.7 29.7  MCHC 32.1  --  31.7 31.9  RDW 13.1  --  13.3 13.3  PLT 269  --  261 258    Cardiac Enzymes Recent Labs  Lab 07/07/18 1740 07/07/18 1808 07/08/18 0109 07/08/18 0656  TROPONINI 0.03* 0.03* 4.19* 13.58*   No results for input(s): TROPIPOC in the last 168 hours.   Radiology    No results found.  Cardiac Studies   Echocardiogram 07/08/2018: Study Conclusions  - Left ventricle: The cavity size was normal. Systolic function was   mildly reduced. The estimated ejection fraction was in the range   of 45% to 50%. Hypokinesis of the basal-midinferoseptal   myocardium. Doppler parameters are consistent with abnormal left   ventricular relaxation (grade 1 diastolic dysfunction).  Impressions:  - Hypokinesis of the basal-mid septum, similar to prior.  Cardiac catheterization PCI 06/22/2018: Conclusions: 1. Diffuse coronary ectasia with 70%, ulcerated appearing proximal LAD stenosis, not significantly changed from prior catheterization last week. 2. Patent mid RCA stent.   Thrombotic occlusion of the rPDA has resolved. 3. Upper normal left ventricular filling pressure. 4. Successful IVUS-guided PCI to proximal LAD using Xience Sierra 3.5 x 12 mm drug-eluting stent (post-dilated to 4.2 mm) with 0% residual stenosis.  Recommendations: 1. Dual antiplatelet therapy with aspirin and ticagrelor for at least 12 months. 2. Aggressive secondary prevention. 3. Likely discharge home tomorrow.  Cardiac catheterization and PCI 07/07/2018:  Previously placed Ost LAD to Prox LAD stent (unknown type) is widely patent.  Prox Cx lesion is 50% stenosed.  Ost 2nd Diag to 2nd Diag lesion is 60% stenosed.  Previously placed Prox RCA to Mid RCA stent (unknown type) is widely patent.  Prox RCA lesion is 40% stenosed.  Mid RCA lesion is 40% stenosed.  Dist Cx lesion is 100% stenosed.  Post intervention, there is a 0% residual stenosis.  Patient Profile     57 y.o. male with a history of inferoposterior STEMI in June status post DES to the mid RCA and staged DES to the LAD with medical management of RPDA occlusion.  He presented with recurrent inferior STEMI on July 9 with culprit lesion being the distal circumflex posterolateral branch, managed with angioplasty.  This was felt to be thrombotic in nature as he also presented with an atrial arrhythmia.  Assessment & Plan    1.  Status post inferior STEMI due to occluded distal circumflex posterior lateral branch, likely embolic in the setting of newly documented atrial fibrillation.  He is status post angioplasty with stabilization and has converted to sinus rhythm on amiodarone.  2.  CAD status post DES to the mid RCA and LAD in June, at that time in the setting of an inferoposterior STEMI.  He is continuing on dual antiplatelet therapy including aspirin and Plavix.  3.  Recently documented rapid atrial fibrillation/flutter, converted to sinus rhythm on amiodarone.  He is now on Eliquis for stroke prophylaxis.  4.  Mixed  hyperlipidemia, continues on Lipitor.  Recent LDL 38.  5.  Peripheral arterial disease.  Plan is for discharge home today on medical therapy.  Would continue on  combination of aspirin, Plavix, and Eliquis for 30 days, then aspirin will be discontinued.  Will discharge home on amiodarone 400 mg twice daily for 5 days then convert to 200 mg twice daily.  Continue Lipitor and Lopressor.  He already has an office visit scheduled with me in [place] in the next few weeks.  Signed, Rozann Lesches, MD  07/11/2018, 9:18 AM

## 2018-07-11 NOTE — Discharge Summary (Signed)
Discharge Summary    Patient ID: Jeremy Raymond,  MRN: [id number], DOB/AGE: [birth year] 55 y.o.  Admit date: 07/07/2018 Discharge date: 07/11/2018  Primary Care Provider: [facility] Primary Cardiologist: Rozann Lesches, MD  Discharge Diagnoses    Principal Problem:   Acute ST elevation myocardial infarction (STEMI) of inferior wall [facility]) Active Problems:   CAD in native artery   Atrial fibrillation with RVR (HCC)   Chronic systolic CHF (congestive heart failure) ([place])   AAA (abdominal aortic aneurysm) without rupture (HCC)   Penile cancer (HCC)   Allergies Allergies  Allergen Reactions  . Black Pepper [[place]] Anaphylaxis  . Capsaicin Anaphylaxis  . Other Anaphylaxis    Hot peppers    Diagnostic Studies/Procedures    Cardiac catheterization 07/07/2018 LAD ostial stent patent; D2 ostial 60 LCx proximal 50, distal 100 RCA proximal 40, proximal stent patent, mid 40 PCI: POBA to distal LCx/posterior lateral branch Diagnostic Diagram       Echocardiogram 07/08/2018 - Left ventricle: The cavity size was normal. Systolic function was   mildly reduced. The estimated ejection fraction was in the range   of 45% to 50%. Hypokinesis of the basal-midinferoseptal   myocardium. Doppler parameters are consistent with abnormal left   ventricular relaxation (grade 1 diastolic dysfunction).  Impressions: - Hypokinesis of the basal-mid septum, similar to prior.  _____________   History of Present Illness     Jeremy Raymond is a 57 y.o. male with recurrent penile cancer s/p surgery, abdominal aortic aneurysm and iliac aneurysm recently being considered for EVAR (Dr. Bridgett Larsson) and coronary artery disease.  He is status post inferior posterior ST elevation myocardial infarction in June 2019 treated with drug-eluting stent to the mid RCA and staged intervention with drug-eluting stent to the proximal LAD.  He initially had a thrombotic occlusion of the right PDA  that was resolved on follow up heart catheterization.  EF was preserved at 55-60% with inferoseptal hypokinesis.  He was seen in outpatient follow up by Dr. Rozann Lesches 06/26/18.  He is being followed by Oncology at [facility] and is considering radiation and chemotherapy for recurrent penile cancer.  He presented to [facility] on 07/07/2018 with recurrent chest pain with radiation to his L shoulder similar to his prior angina.  ECG demonstrated inferior ST elevation and new onset atrial fibrillation/flutter with HRs 150-160s.  He was taken emergently to the cardiac catheterization laboratory.     Hospital Course     Consultants: None  Jeremy Raymond was taken emergently to the cardiac catheterization lab.  This demonstrated patent stents in the LAD and RCA.  The culprit lesion was an occlusion of the distal circumflex posterior lateral branch.  This was treated with balloon angioplasty.  The lesion appeared to be embolic.  Ticagrelor was changed to Clopidogrel.  Oral anticoagulation was started with Apixaban.  Echocardiogram demonstrated worsening LV function with an EF of 45-50% and inferoseptal hypokinesis.  He initially converted to sinus rhythm but reverted back to atrial fibrillation.  It was felt that his coronary events may have been related to a cardiac source of embolus in the setting of atrial fibrillation.  Therefore, amiodarone was initiated to attempt rhythm control.  He converted to normal sinus rhythm and was transitioned to oral amiodarone.  It was recommended that he remain on aspirin 81 mg for 30 days, then discontinue.  He will remain on long-term Apixaban along with Clopidogrel for a minimum of 1 year.  He was evaluated by  Dr. Domenic Polite this morning.  He is doing well without chest pain or shortness of breath and his rhythm.  He is felt stable for discharge to home.  Amiodarone will be continued at 400 mg twice daily for 5 days, then 200 mg twice daily.  He already has an appointment with  Dr. Domenic Polite 07/29/2018 at 20 p.m. at our [facility] office. _____________  Discharge Vitals Blood pressure 114/75, pulse (!) 59, temperature 98.4 F (36.9 C), temperature source Oral, resp. rate 20, height 5\' 9"  (1.753 m), weight 226 lb 4.8 oz (102.6 kg), SpO2 98 %.  Filed Weights   07/07/18 2000 07/08/18 0500 07/11/18 0613  Weight: 234 lb 12.6 oz (106.5 kg) 235 lb 0.2 oz (106.6 kg) 226 lb 4.8 oz (102.6 kg)    Labs & Radiologic Studies    CBC Recent Labs    07/10/18 0202  WBC 6.6  HGB 11.6*  HCT 36.4*  MCV 93.1  PLT 026   Basic Metabolic Panel Recent Labs    07/09/18 0320 07/10/18 0202  NA 134* 136  K 4.1 3.8  CL 103 103  CO2 23 23  GLUCOSE 89 96  BUN 8 8  CREATININE 0.97 0.98  CALCIUM 8.6* 8.9   Liver Function Tests Lab Results  Component Value Date   ALT 31 07/07/2018   AST 21 07/07/2018   ALKPHOS 137 (H) 07/07/2018   BILITOT 0.6 07/07/2018    Total Protein  Date/Time Value Ref Range Status  07/07/2018 06:08 PM 5.7 (L) 6.5 - 8.1 g/dL Final   Albumin  Date/Time Value Ref Range Status  07/07/2018 06:08 PM 2.6 (L) 3.5 - 5.0 g/dL Final     Cardiac Enzymes Troponin I  Date Value Ref Range Status  07/08/2018 13.58 (HH) <0.03 ng/mL Final    Comment:    CRITICAL VALUE NOTED.  VALUE IS CONSISTENT WITH PREVIOUSLY REPORTED AND CALLED VALUE. Performed at [facility], [place] [address] [place], [place] [address]   07/08/2018 4.19 (HH) <0.03 ng/mL Final    Comment:    CRITICAL RESULT CALLED TO, READ BACK BY AND VERIFIED WITH: J.GASQUE,RN 0240 07/08/18 M.CAMPBELL Performed at [facility], [place] [address], [place], [place] [address]   07/07/2018 0.03 (HH) <0.03 ng/mL Final    Comment:    CRITICAL RESULT CALLED TO, READ BACK BY AND VERIFIED WITH: A Andrey Campanile 1928 07/07/2018 WBOND Performed at [place] [address] [place], [place] [address]   07/07/2018 0.03 (HH) <0.03 ng/mL Final    Comment:    CRITICAL RESULT CALLED TO, READ BACK BY  AND VERIFIED WITH: J.GASQUE,RN 2044 07/07/18 M.CAMPBELL Performed at [facility], [place] [address] [place], [place] [address]     Lipid Panel     Component Value Date/Time   CHOL 74 07/07/2018 1808   TRIG 57 07/07/2018 1808   HDL 25 (L) 07/07/2018 1808   CHOLHDL 3.0 07/07/2018 1808   VLDL 11 07/07/2018 1808   LDLCALC 38 07/07/2018 1808    Lab Results  Component Value Date   HGBA1C 5.5 07/07/2018    _____________  No results found. Disposition   Pt is being discharged home today in good condition.  Follow-up Plans & Appointments    Follow-up Information    Satira Sark, MD Follow up on 07/29/2018.   Specialty:  Cardiology Why:  Cardiology Hospital Follow-Up on 07/29/2018 at 3:20PM.  Contact information: [place] [address] [phone]  Discharge Instructions    AMB Referral to Cardiac Rehabilitation - Phase II   Complete by:  As directed    Diagnosis:  STEMI   Amb Referral to Cardiac Rehabilitation   Complete by:  As directed    Diagnosis:   STEMI Coronary Stents     Diet - low sodium heart healthy   Complete by:  As directed    Discharge wound care:   Complete by:  As directed    Call our office for any swelling, bleeding, bruising or fever.   Driving Restrictions   Complete by:  As directed    No driving for 2 weeks.   Increase activity slowly   Complete by:  As directed    Lifting restrictions   Complete by:  As directed    No lifting over 10 lbs for 2 weeks.   Sexual Activity Restrictions   Complete by:  As directed    None for 2 weeks.      Discharge Medications   Allergies as of 07/11/2018      Reactions   Black Pepper [piper] Anaphylaxis   Capsaicin Anaphylaxis   Other Anaphylaxis   Hot peppers      Medication List    STOP taking these medications   carvedilol 6.25 MG tablet Commonly known as:  COREG   isosorbide mononitrate 30 MG 24 hr tablet Commonly known as:  IMDUR   lisinopril 5  MG tablet Commonly known as:  PRINIVIL,ZESTRIL   ticagrelor 90 MG Tabs tablet Commonly known as:  BRILINTA     TAKE these medications   amiodarone 200 MG tablet Commonly known as:  PACERONE Take 2 tablets (400 mg total) by mouth 2 (two) times daily for 5 days, THEN 1 tablet (200 mg total) 2 (two) times daily. Start taking on:  07/11/2018   apixaban 5 MG Tabs tablet Commonly known as:  ELIQUIS Take 1 tablet (5 mg total) by mouth 2 (two) times daily.   aspirin 81 MG EC tablet Take 1 tablet (81 mg total) by mouth daily.   atorvastatin 80 MG tablet Commonly known as:  LIPITOR Take 1 tablet (80 mg total) by mouth daily at 6 PM.   clopidogrel 75 MG tablet Commonly known as:  PLAVIX Take 1 tablet (75 mg total) by mouth daily. Start taking on:  07/12/2018   [employer] PAIN RELIEF 500 MG tablet Generic drug:  acetaminophen Take 1,000 mg by mouth every 8 (eight) hours.   metoprolol tartrate 25 MG tablet Commonly known as:  LOPRESSOR Take 1 tablet (25 mg total) by mouth 2 (two) times daily.   nitroGLYCERIN 0.4 MG SL tablet Commonly known as:  NITROSTAT Place 1 tablet (0.4 mg total) under the tongue every 5 (five) minutes x 3 doses as needed for chest pain.   oxyCODONE 5 MG immediate release tablet Commonly known as:  Oxy IR/ROXICODONE Take 5 mg by mouth every 4 (four) hours as needed for severe pain.   traMADol 50 MG tablet Commonly known as:  ULTRAM Take 50 mg by mouth every 6 (six) hours as needed for moderate pain.            Discharge Care Instructions  (From admission, onward)        Start     Ordered   07/11/18 0000  Discharge wound care:    Comments:  Call our office for any swelling, bleeding, bruising or fever.   07/11/18 1045       Acute coronary syndrome (MI,  NSTEMI, STEMI, etc) this admission?: Yes.     AHA/ACC Clinical Performance & Quality Measures: 1. Aspirin prescribed? - Yes 2. ADP Receptor Inhibitor (Plavix/Clopidogrel, Brilinta/Ticagrelor or  Effient/Prasugrel) prescribed (includes medically managed patients)? - Yes 3. Beta Blocker prescribed? - Yes 4. High Intensity Statin (Lipitor 40-80mg  or Crestor 20-40mg ) prescribed? - Yes 5. EF assessed during THIS hospitalization? - Yes 6. For EF <40%, was ACEI/ARB prescribed? - Not Applicable (EF >/= 75%) 7. For EF <40%, Aldosterone Antagonist (Spironolactone or Eplerenone) prescribed? - Not Applicable (EF >/= 10%) 8. Cardiac Rehab Phase II ordered (Included Medically managed Patients)? - Yes     Outstanding Labs/Studies   None   Duration of Discharge Encounter   Greater than 30 minutes including physician time.  Signed, Richardson Dopp, PA-C 07/11/2018, 10:52 AM

## 2018-07-27 ENCOUNTER — Telehealth: Payer: Self-pay

## 2018-07-27 NOTE — Telephone Encounter (Signed)
Remo Lipps at [place] office notified and verbalized understanding.

## 2018-07-27 NOTE — Telephone Encounter (Signed)
Received phone call from Dr. Purcell Mouton office Jeremy Raymond). Patient was seen in office today in extreme pain. Patient has a lesion in his right groin area. Dr. Edd Arbour would like to go in surgically to fix this but did not know if it would be too high risk for patient to come off eliquis and plavix. This would strictly be for palliative reasons. Patient does have stage 4 cancer.

## 2018-07-27 NOTE — Telephone Encounter (Signed)
Medically complex patient, just recently hospitalized with a recurrent ACS.  He would be very high risk at this time for stent thrombosis if his antiplatelet regimen is withheld in light of recent DES interventions.  Eliquis could potentially be held in the short-term.
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
## 2018-07-29 ENCOUNTER — Encounter: Payer: Self-pay | Admitting: Cardiology

## 2018-07-29 ENCOUNTER — Ambulatory Visit (INDEPENDENT_AMBULATORY_CARE_PROVIDER_SITE_OTHER): Payer: BLUE CROSS/BLUE SHIELD | Admitting: Cardiology

## 2018-07-29 VITALS — BP 100/80 | HR 66 | Ht 69.0 in | Wt 230.0 lb

## 2018-07-29 DIAGNOSIS — I714 Abdominal aortic aneurysm, without rupture, unspecified: Secondary | ICD-10-CM

## 2018-07-29 DIAGNOSIS — I723 Aneurysm of iliac artery: Secondary | ICD-10-CM

## 2018-07-29 DIAGNOSIS — E782 Mixed hyperlipidemia: Secondary | ICD-10-CM

## 2018-07-29 DIAGNOSIS — I25119 Atherosclerotic heart disease of native coronary artery with unspecified angina pectoris: Secondary | ICD-10-CM

## 2018-07-29 DIAGNOSIS — I480 Paroxysmal atrial fibrillation: Secondary | ICD-10-CM | POA: Diagnosis not present

## 2018-07-29 DIAGNOSIS — C609 Malignant neoplasm of penis, unspecified: Secondary | ICD-10-CM

## 2018-07-29 MED ORDER — AMIODARONE HCL 200 MG PO TABS: 200.0000 mg | ORAL_TABLET | Freq: Every day | ORAL | 0 refills | Status: AC

## 2018-07-29 NOTE — Patient Instructions (Addendum)
Medication Instructions:  Your physician has recommended you make the following change in your medication:    DECREASE Amiodarone 200 mg once daily   ON AUGUST 9TH STOP ASPIRIN   Please continue all other medications as prescribed   Labwork:  CMET/CBC/TSH  Orders given today  Just prior to next visit  Testing/Procedures: NONE  Follow-Up: Your physician recommends that you schedule a follow-up appointment in: 2 [place]  Any Other Special Instructions Will Be Listed Below (If Applicable).  If you need a refill on your cardiac medications before your next appointment, please call your pharmacy.

## 2018-09-25 ENCOUNTER — Telehealth: Payer: Self-pay | Admitting: *Deleted

## 2018-09-25 NOTE — Telephone Encounter (Signed)
Wife called to cancel appt for patient and said that he is under the care of hospice for penile cancer.

## 2018-09-29 ENCOUNTER — Ambulatory Visit: Payer: BLUE CROSS/BLUE SHIELD | Admitting: Cardiology

## 2018-10-30 DEATH — deceased

## 2019-03-07 IMAGING — US US SCROTUM W/ DOPPLER COMPLETE
1 series · 13 of 25 positions shown · non-contrast
Comparison: None.

CLINICAL DATA: Swelling of the scrotum bilaterally over the last 2
weeks history of penile carcinoma

EXAM:
SCROTAL ULTRASOUND
DOPPLER ULTRASOUND OF THE TESTICLES
TECHNIQUE: Complete ultrasound examination of the testicles, epididymis, and
other scrotal structures was performed. Color and spectral Doppler
ultrasound were also utilized to evaluate blood flow to the
testicles.

[Series 1: us scrotum w/ doppler complete · 13 of 72 slices shown]
[im 1/72]
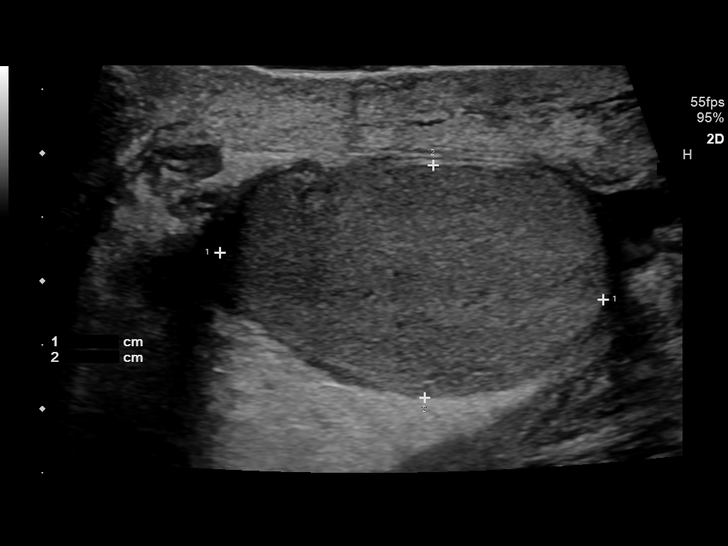
[im 6/72]
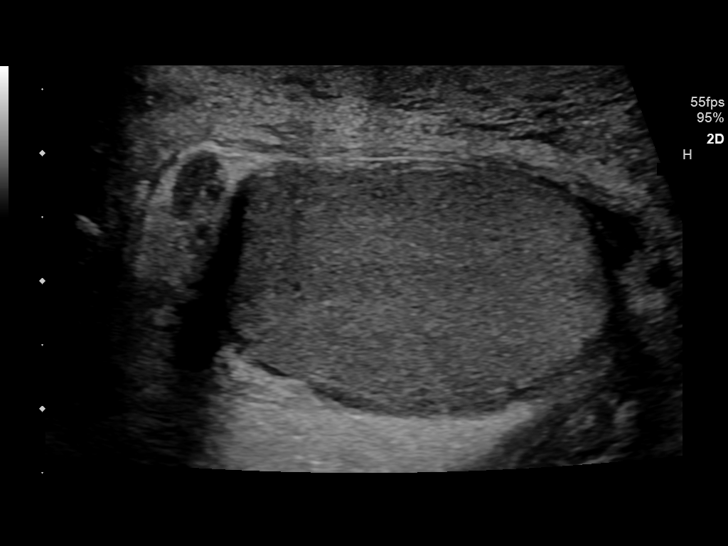
[im 12/72]
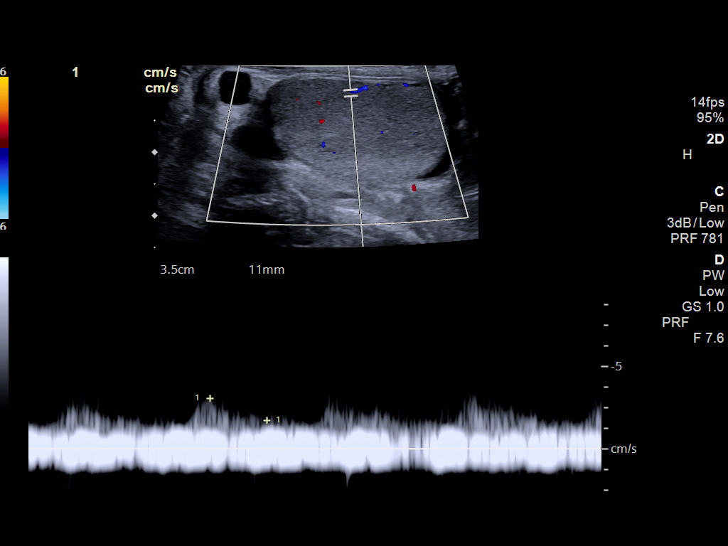
[im 18/72]
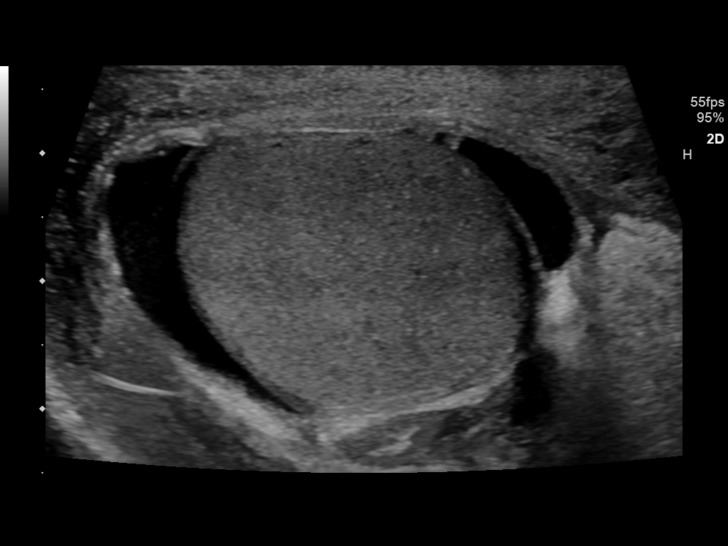
[im 24/72]
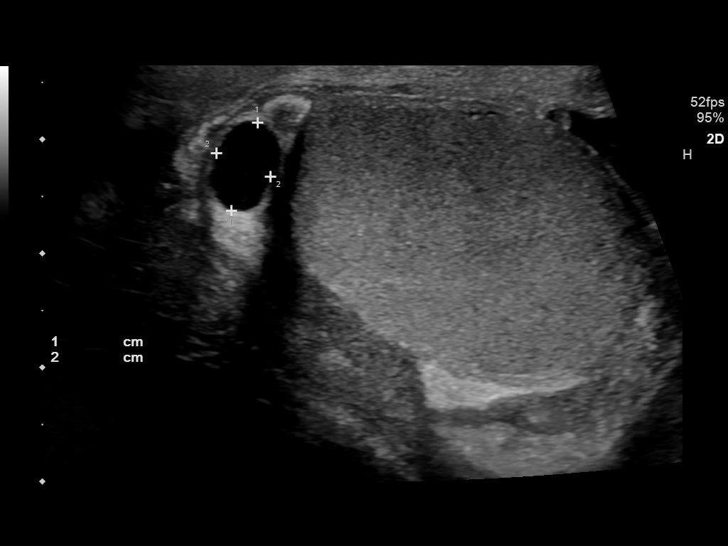
[im 30/72]
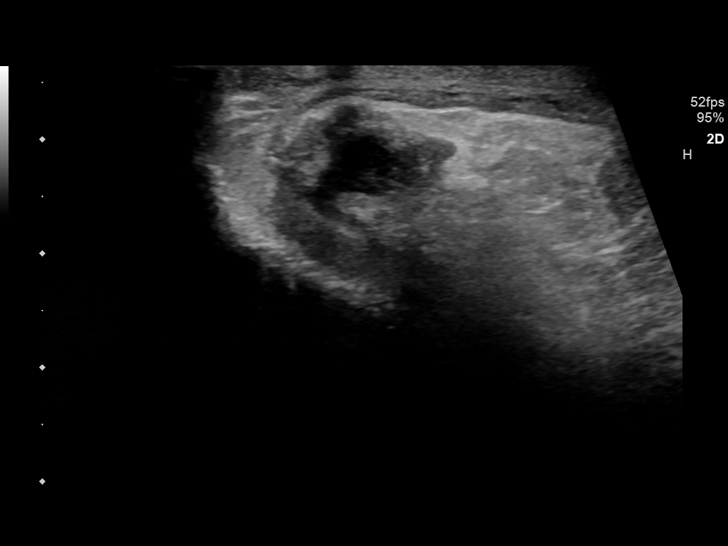
[im 36/72]
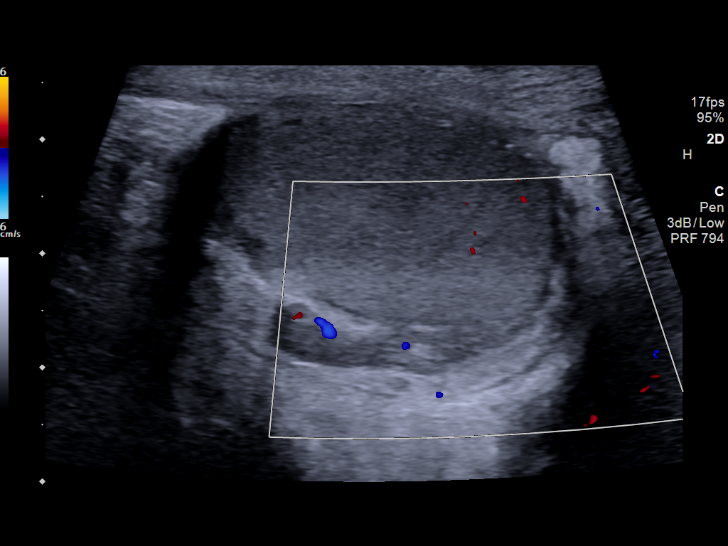
[im 42/72]
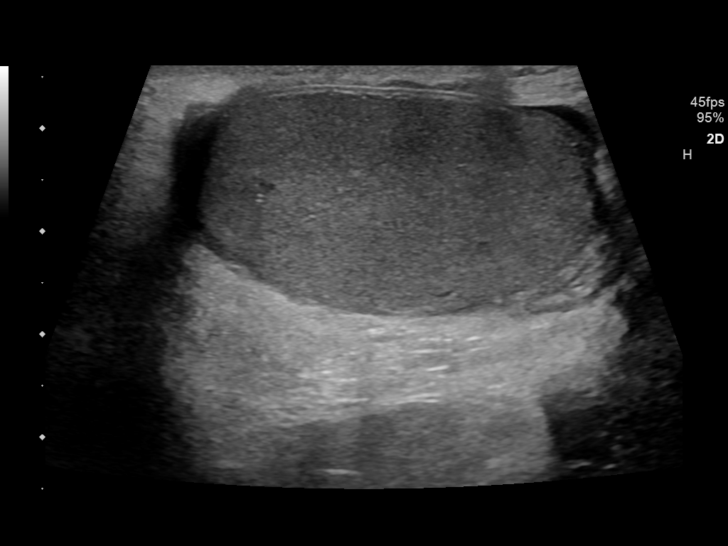
[im 48/72]
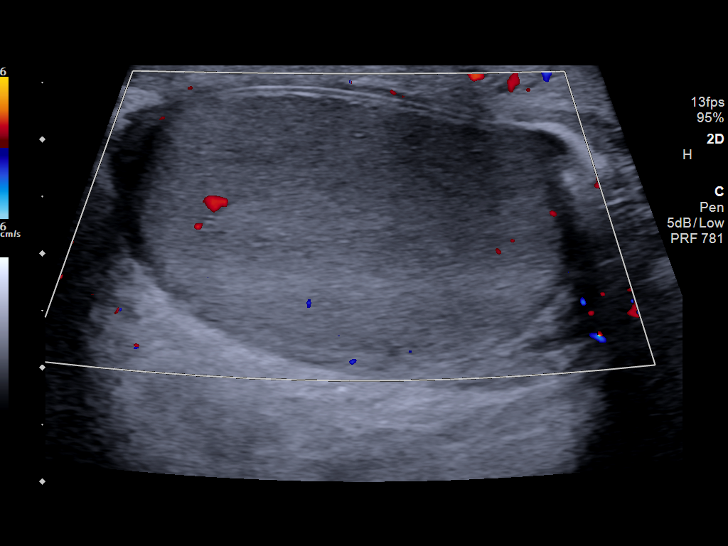
[im 54/72]
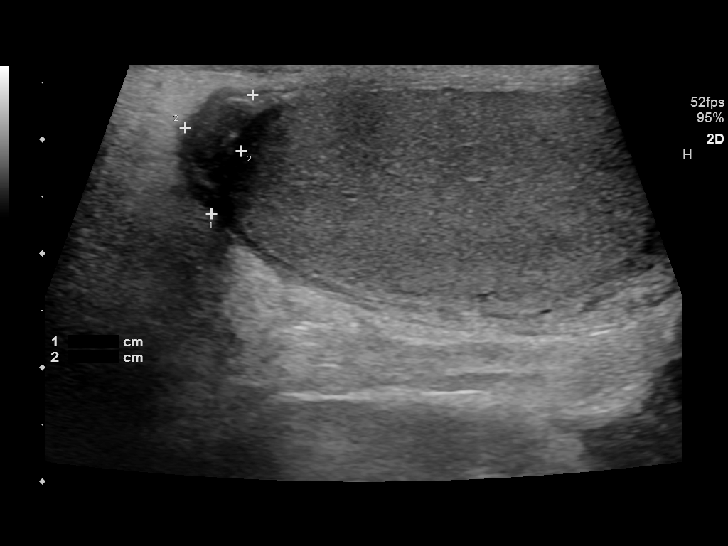
[im 60/72]
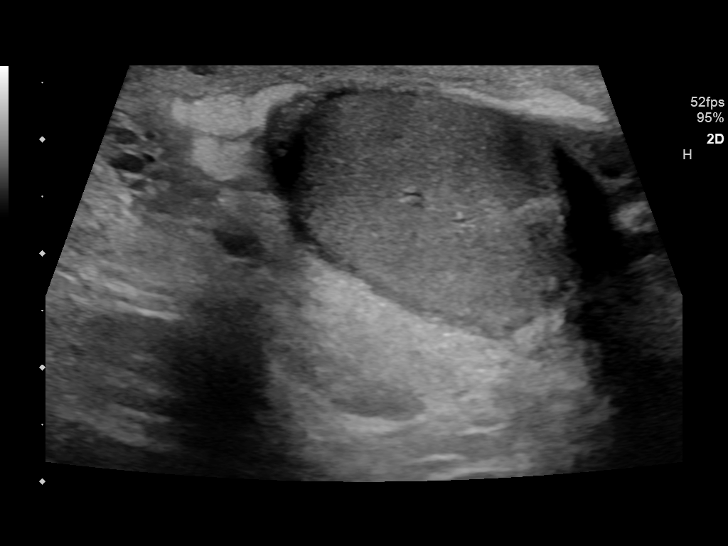
[im 66/72]
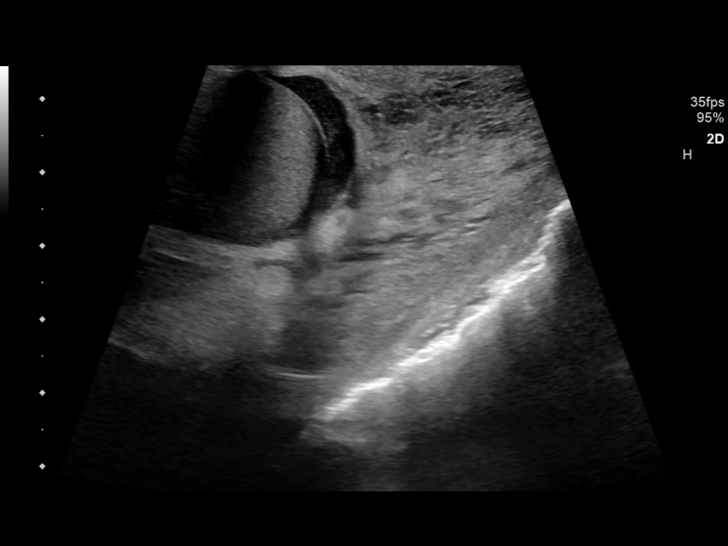
[im 72/72]
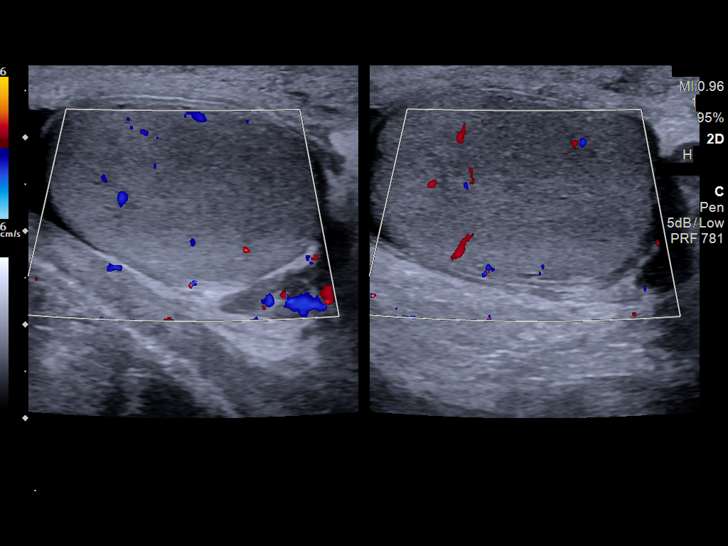

[13 of 25 positions shown; findings below may reference images not displayed]

FINDINGS: Right testicle

Measurements: The right testicle measures 3.0 x 1.8 x 2.6 cm.. No
intratesticular abnormality is seen. Blood flow is demonstrated to
the right testicle with arterial and venous waveforms.

Left testicle

Measurements: The left testicle measures 3.8 x 2.2 x 3.2 cm.. No
intratesticular abnormality is seen. Blood flow is demonstrated to
the left testicle as well with arterial and venous waveforms.

Right epididymis: The right epididymis is unremarkable other than a
single 8 mm epididymal cyst.

Left epididymis:  The left epididymis also is unremarkable.

Hydrocele:  There is a small amount of fluid located bilaterally.

Varicocele:  No varicocele is seen.

There is diffuse edema of the scrotal sac without discrete
abnormality.

Pulsed Doppler interrogation of both testes demonstrates normal low
resistance arterial and venous waveforms bilaterally.
IMPRESSION: 1. No intratesticular abnormality is seen. Blood flow is
demonstrated to both testicles.
2. Diffuse scrotal wall thickening without focal abnormality.
3. Small hydroceles bilaterally.
